# Patient Record
Sex: Female | Born: 1935 | Race: Black or African American | Hispanic: No | Smoking: Former smoker
Health system: Southern US, Community
[De-identification: ages and names within clinical notes are randomized; demographics above are authoritative.]

## PROBLEM LIST (undated history)

## (undated) DIAGNOSIS — M79609 Pain in unspecified limb: Secondary | ICD-10-CM

## (undated) DIAGNOSIS — E049 Nontoxic goiter, unspecified: Secondary | ICD-10-CM

## (undated) DIAGNOSIS — E785 Hyperlipidemia, unspecified: Secondary | ICD-10-CM

## (undated) DIAGNOSIS — Z87828 Personal history of other (healed) physical injury and trauma: Secondary | ICD-10-CM

## (undated) DIAGNOSIS — R05 Cough: Secondary | ICD-10-CM

## (undated) DIAGNOSIS — R221 Localized swelling, mass and lump, neck: Secondary | ICD-10-CM

## (undated) DIAGNOSIS — A419 Sepsis, unspecified organism: Secondary | ICD-10-CM

## (undated) DIAGNOSIS — R55 Syncope and collapse: Secondary | ICD-10-CM

## (undated) DIAGNOSIS — M1990 Unspecified osteoarthritis, unspecified site: Secondary | ICD-10-CM

## (undated) DIAGNOSIS — R809 Proteinuria, unspecified: Secondary | ICD-10-CM

## (undated) DIAGNOSIS — N289 Disorder of kidney and ureter, unspecified: Secondary | ICD-10-CM

## (undated) DIAGNOSIS — Z87440 Personal history of urinary (tract) infections: Secondary | ICD-10-CM

## (undated) DIAGNOSIS — I10 Essential (primary) hypertension: Secondary | ICD-10-CM

## (undated) DIAGNOSIS — I714 Abdominal aortic aneurysm, without rupture, unspecified: Secondary | ICD-10-CM

## (undated) DIAGNOSIS — J449 Chronic obstructive pulmonary disease, unspecified: Secondary | ICD-10-CM

## (undated) DIAGNOSIS — R001 Bradycardia, unspecified: Secondary | ICD-10-CM

## (undated) DIAGNOSIS — E669 Obesity, unspecified: Secondary | ICD-10-CM

## (undated) DIAGNOSIS — R053 Chronic cough: Secondary | ICD-10-CM

## (undated) DIAGNOSIS — N390 Urinary tract infection, site not specified: Secondary | ICD-10-CM

## (undated) DIAGNOSIS — D649 Anemia, unspecified: Secondary | ICD-10-CM

## (undated) DIAGNOSIS — S065X9A Traumatic subdural hemorrhage with loss of consciousness of unspecified duration, initial encounter: Secondary | ICD-10-CM

## (undated) DIAGNOSIS — I498 Other specified cardiac arrhythmias: Secondary | ICD-10-CM

## (undated) DIAGNOSIS — I82409 Acute embolism and thrombosis of unspecified deep veins of unspecified lower extremity: Secondary | ICD-10-CM

## (undated) HISTORY — DX: Personal history of urinary (tract) infections: Z87.440

## (undated) HISTORY — DX: Chronic cough: R05.3

## (undated) HISTORY — DX: Essential (primary) hypertension: I10

## (undated) HISTORY — DX: Disorder of kidney and ureter, unspecified: N28.9

## (undated) HISTORY — PX: CHOLECYSTECTOMY: SHX55

## (undated) HISTORY — DX: Localized swelling, mass and lump, neck: R22.1

## (undated) HISTORY — DX: Proteinuria, unspecified: R80.9

## (undated) HISTORY — DX: Obesity, unspecified: E66.9

## (undated) HISTORY — DX: Abdominal aortic aneurysm, without rupture: I71.4

## (undated) HISTORY — DX: Hyperlipidemia, unspecified: E78.5

## (undated) HISTORY — DX: Sepsis, unspecified organism: A41.9

## (undated) HISTORY — DX: Other specified cardiac arrhythmias: I49.8

## (undated) HISTORY — DX: Unspecified osteoarthritis, unspecified site: M19.90

## (undated) HISTORY — DX: Urinary tract infection, site not specified: N39.0

## (undated) HISTORY — DX: Cough: R05

## (undated) HISTORY — DX: Bradycardia, unspecified: R00.1

## (undated) HISTORY — PX: OTHER SURGICAL HISTORY: SHX169

## (undated) HISTORY — DX: Acute embolism and thrombosis of unspecified deep veins of unspecified lower extremity: I82.409

## (undated) HISTORY — DX: Personal history of other (healed) physical injury and trauma: Z87.828

## (undated) HISTORY — DX: Chronic obstructive pulmonary disease, unspecified: J44.9

## (undated) HISTORY — DX: Abdominal aortic aneurysm, without rupture, unspecified: I71.40

## (undated) HISTORY — DX: Syncope and collapse: R55

## (undated) HISTORY — DX: Traumatic subdural hemorrhage with loss of consciousness of unspecified duration, initial encounter: S06.5X9A

## (undated) HISTORY — PX: PACEMAKER PLACEMENT: SHX43

## (undated) HISTORY — DX: Nontoxic goiter, unspecified: E04.9

## (undated) HISTORY — DX: Anemia, unspecified: D64.9

## (undated) HISTORY — PX: DOPPLER ECHOCARDIOGRAPHY: SHX263

## (undated) HISTORY — DX: Pain in unspecified limb: M79.609

## (undated) HISTORY — PX: PACEMAKER INSERTION: SHX728

---

## 1997-07-30 ENCOUNTER — Emergency Department (HOSPITAL_COMMUNITY): Admission: EM | Admit: 1997-07-30 | Discharge: 1997-07-30 | Payer: Self-pay | Admitting: Emergency Medicine

## 1998-03-11 ENCOUNTER — Emergency Department (HOSPITAL_COMMUNITY): Admission: EM | Admit: 1998-03-11 | Discharge: 1998-03-11 | Payer: Self-pay | Admitting: Emergency Medicine

## 1998-03-11 ENCOUNTER — Encounter: Payer: Self-pay | Admitting: Emergency Medicine

## 1998-03-26 LAB — HM COLONOSCOPY

## 1998-05-25 ENCOUNTER — Other Ambulatory Visit: Admission: RE | Admit: 1998-05-25 | Discharge: 1998-05-25 | Payer: Self-pay | Admitting: Gastroenterology

## 1999-09-06 ENCOUNTER — Encounter: Admission: RE | Admit: 1999-09-06 | Discharge: 1999-12-05 | Payer: Self-pay | Admitting: Internal Medicine

## 2000-09-23 HISTORY — PX: OTHER SURGICAL HISTORY: SHX169

## 2000-11-28 ENCOUNTER — Ambulatory Visit (HOSPITAL_COMMUNITY): Admission: RE | Admit: 2000-11-28 | Discharge: 2000-11-28 | Payer: Self-pay | Admitting: Orthopedic Surgery

## 2000-11-28 ENCOUNTER — Encounter: Payer: Self-pay | Admitting: Orthopedic Surgery

## 2001-07-14 ENCOUNTER — Other Ambulatory Visit: Admission: RE | Admit: 2001-07-14 | Discharge: 2001-07-14 | Payer: Self-pay | Admitting: Internal Medicine

## 2002-12-28 ENCOUNTER — Encounter: Admission: RE | Admit: 2002-12-28 | Discharge: 2002-12-28 | Payer: Self-pay | Admitting: Internal Medicine

## 2002-12-28 ENCOUNTER — Encounter: Payer: Self-pay | Admitting: Internal Medicine

## 2003-01-21 ENCOUNTER — Other Ambulatory Visit: Admission: RE | Admit: 2003-01-21 | Discharge: 2003-01-21 | Payer: Self-pay | Admitting: Obstetrics and Gynecology

## 2003-02-11 ENCOUNTER — Inpatient Hospital Stay (HOSPITAL_COMMUNITY): Admission: EM | Admit: 2003-02-11 | Discharge: 2003-02-12 | Payer: Self-pay | Admitting: Emergency Medicine

## 2003-03-12 ENCOUNTER — Encounter (INDEPENDENT_AMBULATORY_CARE_PROVIDER_SITE_OTHER): Payer: Self-pay | Admitting: Specialist

## 2003-03-12 ENCOUNTER — Ambulatory Visit (HOSPITAL_BASED_OUTPATIENT_CLINIC_OR_DEPARTMENT_OTHER): Admission: RE | Admit: 2003-03-12 | Discharge: 2003-03-12 | Payer: Self-pay | Admitting: Obstetrics and Gynecology

## 2003-03-12 ENCOUNTER — Ambulatory Visit (HOSPITAL_COMMUNITY): Admission: RE | Admit: 2003-03-12 | Discharge: 2003-03-12 | Payer: Self-pay | Admitting: Obstetrics and Gynecology

## 2003-05-21 ENCOUNTER — Emergency Department (HOSPITAL_COMMUNITY): Admission: EM | Admit: 2003-05-21 | Discharge: 2003-05-21 | Payer: Self-pay | Admitting: Emergency Medicine

## 2003-05-25 HISTORY — PX: OTHER SURGICAL HISTORY: SHX169

## 2003-05-31 ENCOUNTER — Ambulatory Visit (HOSPITAL_COMMUNITY): Admission: RE | Admit: 2003-05-31 | Discharge: 2003-05-31 | Payer: Self-pay | Admitting: Vascular Surgery

## 2003-06-08 ENCOUNTER — Inpatient Hospital Stay (HOSPITAL_COMMUNITY): Admission: RE | Admit: 2003-06-08 | Discharge: 2003-06-09 | Payer: Self-pay | Admitting: Vascular Surgery

## 2003-06-25 HISTORY — PX: ABDOMINAL AORTIC ANEURYSM REPAIR: SUR1152

## 2003-07-05 ENCOUNTER — Inpatient Hospital Stay (HOSPITAL_COMMUNITY): Admission: RE | Admit: 2003-07-05 | Discharge: 2003-07-12 | Payer: Self-pay | Admitting: Vascular Surgery

## 2003-07-05 ENCOUNTER — Encounter (INDEPENDENT_AMBULATORY_CARE_PROVIDER_SITE_OTHER): Payer: Self-pay | Admitting: *Deleted

## 2004-02-04 ENCOUNTER — Ambulatory Visit: Payer: Self-pay | Admitting: Internal Medicine

## 2004-04-11 ENCOUNTER — Other Ambulatory Visit: Admission: RE | Admit: 2004-04-11 | Discharge: 2004-04-11 | Payer: Self-pay | Admitting: Obstetrics and Gynecology

## 2004-06-13 ENCOUNTER — Ambulatory Visit: Payer: Self-pay | Admitting: Internal Medicine

## 2004-06-16 ENCOUNTER — Emergency Department (HOSPITAL_COMMUNITY): Admission: EM | Admit: 2004-06-16 | Discharge: 2004-06-16 | Payer: Self-pay | Admitting: Emergency Medicine

## 2004-06-17 ENCOUNTER — Inpatient Hospital Stay (HOSPITAL_COMMUNITY): Admission: AD | Admit: 2004-06-17 | Discharge: 2004-06-27 | Payer: Self-pay | Admitting: Emergency Medicine

## 2004-06-19 ENCOUNTER — Ambulatory Visit: Payer: Self-pay | Admitting: Pulmonary Disease

## 2004-06-23 ENCOUNTER — Encounter: Payer: Self-pay | Admitting: Pulmonary Disease

## 2004-06-28 ENCOUNTER — Ambulatory Visit: Payer: Self-pay | Admitting: Internal Medicine

## 2004-07-28 ENCOUNTER — Ambulatory Visit: Payer: Self-pay | Admitting: Internal Medicine

## 2004-09-25 ENCOUNTER — Ambulatory Visit: Payer: Self-pay | Admitting: Internal Medicine

## 2004-12-07 ENCOUNTER — Ambulatory Visit (HOSPITAL_COMMUNITY): Admission: RE | Admit: 2004-12-07 | Discharge: 2004-12-07 | Payer: Self-pay | Admitting: Neurology

## 2004-12-19 ENCOUNTER — Emergency Department (HOSPITAL_COMMUNITY): Admission: EM | Admit: 2004-12-19 | Discharge: 2004-12-19 | Payer: Self-pay | Admitting: Emergency Medicine

## 2004-12-24 DIAGNOSIS — A419 Sepsis, unspecified organism: Secondary | ICD-10-CM

## 2004-12-24 HISTORY — DX: Sepsis, unspecified organism: A41.9

## 2005-01-02 ENCOUNTER — Ambulatory Visit: Payer: Self-pay | Admitting: Internal Medicine

## 2005-01-02 LAB — CONVERTED CEMR LAB
ALT: 11 units/L (ref 0–40)
AST: 17 units/L (ref 0–37)
Albumin: 3.8 g/dL (ref 3.5–5.2)
BUN: 48 mg/dL — ABNORMAL HIGH (ref 6–23)
CO2: 29 meq/L (ref 19–32)
Chloride: 104 meq/L (ref 96–112)
Cholesterol: 187 mg/dL (ref 0–200)
Eosinophils Absolute: 0.1 10*3/uL (ref 0.0–0.6)
HCT: 34.9 % — ABNORMAL LOW (ref 36.0–46.0)
Hemoglobin: 11.8 g/dL — ABNORMAL LOW (ref 12.0–15.0)
LDL Cholesterol: 125 mg/dL — ABNORMAL HIGH (ref 0–99)
Lymphocytes Relative: 21.7 % (ref 12.0–46.0)
MCHC: 33.7 g/dL (ref 30.0–36.0)
MCV: 85.1 fL (ref 78.0–100.0)
Monocytes Absolute: 0.3 10*3/uL (ref 0.2–0.7)
VLDL: 21 mg/dL (ref 0–40)

## 2005-01-04 ENCOUNTER — Inpatient Hospital Stay (HOSPITAL_COMMUNITY): Admission: EM | Admit: 2005-01-04 | Discharge: 2005-01-09 | Payer: Self-pay | Admitting: Emergency Medicine

## 2005-04-17 ENCOUNTER — Ambulatory Visit: Payer: Self-pay | Admitting: Internal Medicine

## 2005-05-10 ENCOUNTER — Ambulatory Visit (HOSPITAL_COMMUNITY): Admission: RE | Admit: 2005-05-10 | Discharge: 2005-05-10 | Payer: Self-pay | Admitting: Urology

## 2005-06-15 ENCOUNTER — Ambulatory Visit: Payer: Self-pay | Admitting: Internal Medicine

## 2005-06-26 ENCOUNTER — Ambulatory Visit (HOSPITAL_COMMUNITY): Admission: RE | Admit: 2005-06-26 | Discharge: 2005-06-26 | Payer: Self-pay | Admitting: Internal Medicine

## 2005-08-08 ENCOUNTER — Emergency Department (HOSPITAL_COMMUNITY): Admission: EM | Admit: 2005-08-08 | Discharge: 2005-08-08 | Payer: Self-pay | Admitting: Emergency Medicine

## 2005-09-23 ENCOUNTER — Emergency Department (HOSPITAL_COMMUNITY): Admission: EM | Admit: 2005-09-23 | Discharge: 2005-09-23 | Payer: Self-pay | Admitting: Emergency Medicine

## 2005-10-15 ENCOUNTER — Ambulatory Visit: Payer: Self-pay | Admitting: Internal Medicine

## 2005-11-26 ENCOUNTER — Emergency Department (HOSPITAL_COMMUNITY): Admission: EM | Admit: 2005-11-26 | Discharge: 2005-11-26 | Payer: Self-pay | Admitting: Emergency Medicine

## 2005-12-27 ENCOUNTER — Emergency Department (HOSPITAL_COMMUNITY): Admission: EM | Admit: 2005-12-27 | Discharge: 2005-12-27 | Payer: Self-pay | Admitting: Emergency Medicine

## 2006-01-19 ENCOUNTER — Emergency Department (HOSPITAL_COMMUNITY): Admission: EM | Admit: 2006-01-19 | Discharge: 2006-01-19 | Payer: Self-pay | Admitting: Emergency Medicine

## 2006-02-18 ENCOUNTER — Ambulatory Visit: Payer: Self-pay | Admitting: Internal Medicine

## 2006-04-10 ENCOUNTER — Emergency Department (HOSPITAL_COMMUNITY): Admission: EM | Admit: 2006-04-10 | Discharge: 2006-04-10 | Payer: Self-pay | Admitting: Emergency Medicine

## 2006-05-06 ENCOUNTER — Emergency Department (HOSPITAL_COMMUNITY): Admission: EM | Admit: 2006-05-06 | Discharge: 2006-05-06 | Payer: Self-pay | Admitting: Emergency Medicine

## 2006-06-06 ENCOUNTER — Ambulatory Visit: Payer: Self-pay | Admitting: Internal Medicine

## 2006-06-06 LAB — CONVERTED CEMR LAB
CO2: 30 meq/L (ref 19–32)
HCT: 41.1 % (ref 36.0–46.0)
Hemoglobin: 13.5 g/dL (ref 12.0–15.0)
Hgb A1c MFr Bld: 6.2 % — ABNORMAL HIGH (ref 4.6–6.0)
Potassium: 4.8 meq/L (ref 3.5–5.1)
Sodium: 142 meq/L (ref 135–145)
TSH: 2.41 microintl units/mL (ref 0.35–5.50)

## 2006-07-12 ENCOUNTER — Encounter: Payer: Self-pay | Admitting: Internal Medicine

## 2006-07-31 ENCOUNTER — Ambulatory Visit (HOSPITAL_COMMUNITY): Admission: RE | Admit: 2006-07-31 | Discharge: 2006-07-31 | Payer: Self-pay | Admitting: Urology

## 2006-10-15 ENCOUNTER — Encounter: Payer: Self-pay | Admitting: Internal Medicine

## 2006-10-25 ENCOUNTER — Encounter: Payer: Self-pay | Admitting: Internal Medicine

## 2006-11-04 ENCOUNTER — Encounter: Payer: Self-pay | Admitting: Internal Medicine

## 2006-11-19 ENCOUNTER — Telehealth: Payer: Self-pay | Admitting: Internal Medicine

## 2006-11-26 DIAGNOSIS — I10 Essential (primary) hypertension: Secondary | ICD-10-CM

## 2006-11-26 DIAGNOSIS — M1990 Unspecified osteoarthritis, unspecified site: Secondary | ICD-10-CM | POA: Insufficient documentation

## 2006-11-26 DIAGNOSIS — E669 Obesity, unspecified: Secondary | ICD-10-CM | POA: Insufficient documentation

## 2006-11-26 DIAGNOSIS — N390 Urinary tract infection, site not specified: Secondary | ICD-10-CM | POA: Insufficient documentation

## 2006-11-26 DIAGNOSIS — E119 Type 2 diabetes mellitus without complications: Secondary | ICD-10-CM

## 2006-11-26 DIAGNOSIS — I714 Abdominal aortic aneurysm, without rupture, unspecified: Secondary | ICD-10-CM | POA: Insufficient documentation

## 2006-11-26 DIAGNOSIS — E785 Hyperlipidemia, unspecified: Secondary | ICD-10-CM

## 2006-11-26 DIAGNOSIS — R809 Proteinuria, unspecified: Secondary | ICD-10-CM | POA: Insufficient documentation

## 2006-11-26 DIAGNOSIS — R05 Cough: Secondary | ICD-10-CM | POA: Insufficient documentation

## 2006-11-26 DIAGNOSIS — N259 Disorder resulting from impaired renal tubular function, unspecified: Secondary | ICD-10-CM | POA: Insufficient documentation

## 2006-12-17 ENCOUNTER — Encounter: Payer: Self-pay | Admitting: Internal Medicine

## 2007-01-07 ENCOUNTER — Ambulatory Visit: Payer: Self-pay | Admitting: Internal Medicine

## 2007-01-07 DIAGNOSIS — J309 Allergic rhinitis, unspecified: Secondary | ICD-10-CM | POA: Insufficient documentation

## 2007-01-07 LAB — CONVERTED CEMR LAB
Glucose, Urine, Semiquant: NEGATIVE
Ketones, urine, test strip: NEGATIVE
Urobilinogen, UA: 0.2

## 2007-01-08 ENCOUNTER — Encounter: Payer: Self-pay | Admitting: Internal Medicine

## 2007-01-09 LAB — CONVERTED CEMR LAB
ALT: 22 units/L (ref 0–35)
AST: 36 units/L (ref 0–37)
Albumin: 4.4 g/dL (ref 3.5–5.2)
Alkaline Phosphatase: 78 units/L (ref 39–117)
Basophils Absolute: 0 10*3/uL (ref 0.0–0.1)
Calcium: 10.8 mg/dL — ABNORMAL HIGH (ref 8.4–10.5)
Chloride: 107 meq/L (ref 96–112)
Cholesterol: 145 mg/dL (ref 0–200)
Creatinine, Ser: 2.5 mg/dL — ABNORMAL HIGH (ref 0.4–1.2)
GFR calc Af Amer: 24 mL/min
GFR calc non Af Amer: 20 mL/min
HCT: 39.6 % (ref 36.0–46.0)
Hgb A1c MFr Bld: 6 % (ref 4.6–6.0)
LDL Cholesterol: 83 mg/dL (ref 0–99)
MCV: 87.6 fL (ref 78.0–100.0)
Potassium: 5.9 meq/L — ABNORMAL HIGH (ref 3.5–5.1)
Sodium: 142 meq/L (ref 135–145)
Total Bilirubin: 0.6 mg/dL (ref 0.3–1.2)
Total CHOL/HDL Ratio: 3.3
Triglycerides: 88 mg/dL (ref 0–149)
VLDL: 18 mg/dL (ref 0–40)

## 2007-01-13 ENCOUNTER — Ambulatory Visit: Payer: Self-pay | Admitting: Internal Medicine

## 2007-03-07 ENCOUNTER — Encounter: Payer: Self-pay | Admitting: Internal Medicine

## 2007-03-17 ENCOUNTER — Ambulatory Visit: Payer: Self-pay | Admitting: Internal Medicine

## 2007-04-02 ENCOUNTER — Encounter: Payer: Self-pay | Admitting: Internal Medicine

## 2007-04-06 ENCOUNTER — Encounter: Payer: Self-pay | Admitting: Internal Medicine

## 2007-04-06 ENCOUNTER — Emergency Department (HOSPITAL_COMMUNITY): Admission: EM | Admit: 2007-04-06 | Discharge: 2007-04-06 | Payer: Self-pay | Admitting: Emergency Medicine

## 2007-04-15 ENCOUNTER — Encounter: Payer: Self-pay | Admitting: Internal Medicine

## 2007-04-28 ENCOUNTER — Ambulatory Visit: Payer: Self-pay | Admitting: Internal Medicine

## 2007-04-28 DIAGNOSIS — K219 Gastro-esophageal reflux disease without esophagitis: Secondary | ICD-10-CM

## 2007-04-28 LAB — CONVERTED CEMR LAB
Bilirubin Urine: NEGATIVE
Blood in Urine, dipstick: NEGATIVE
Glucose, Urine, Semiquant: NEGATIVE
WBC Urine, dipstick: NEGATIVE
pH: 5.5

## 2007-05-13 ENCOUNTER — Telehealth: Payer: Self-pay | Admitting: *Deleted

## 2007-05-20 ENCOUNTER — Telehealth: Payer: Self-pay | Admitting: Internal Medicine

## 2007-05-24 ENCOUNTER — Emergency Department (HOSPITAL_COMMUNITY): Admission: EM | Admit: 2007-05-24 | Discharge: 2007-05-24 | Payer: Self-pay | Admitting: Emergency Medicine

## 2007-07-09 ENCOUNTER — Telehealth: Payer: Self-pay | Admitting: *Deleted

## 2007-07-09 ENCOUNTER — Encounter: Payer: Self-pay | Admitting: Internal Medicine

## 2007-07-18 ENCOUNTER — Encounter: Payer: Self-pay | Admitting: Internal Medicine

## 2007-07-23 ENCOUNTER — Telehealth: Payer: Self-pay | Admitting: Internal Medicine

## 2007-09-10 ENCOUNTER — Telehealth: Payer: Self-pay | Admitting: *Deleted

## 2007-09-30 ENCOUNTER — Encounter: Payer: Self-pay | Admitting: Internal Medicine

## 2007-10-11 ENCOUNTER — Emergency Department (HOSPITAL_COMMUNITY): Admission: EM | Admit: 2007-10-11 | Discharge: 2007-10-11 | Payer: Self-pay | Admitting: Emergency Medicine

## 2007-10-15 ENCOUNTER — Telehealth: Payer: Self-pay | Admitting: *Deleted

## 2007-11-07 ENCOUNTER — Emergency Department (HOSPITAL_COMMUNITY): Admission: EM | Admit: 2007-11-07 | Discharge: 2007-11-07 | Payer: Self-pay | Admitting: Emergency Medicine

## 2007-11-26 ENCOUNTER — Encounter: Payer: Self-pay | Admitting: Internal Medicine

## 2007-12-21 ENCOUNTER — Emergency Department (HOSPITAL_COMMUNITY): Admission: EM | Admit: 2007-12-21 | Discharge: 2007-12-21 | Payer: Self-pay | Admitting: Orthopedic Surgery

## 2008-01-02 ENCOUNTER — Ambulatory Visit: Payer: Self-pay | Admitting: Internal Medicine

## 2008-01-02 LAB — CONVERTED CEMR LAB: Blood Glucose, Fingerstick: 117

## 2008-01-08 ENCOUNTER — Ambulatory Visit (HOSPITAL_COMMUNITY): Admission: RE | Admit: 2008-01-08 | Discharge: 2008-01-08 | Payer: Self-pay | Admitting: Pulmonary Disease

## 2008-01-14 ENCOUNTER — Encounter: Payer: Self-pay | Admitting: Internal Medicine

## 2008-01-29 ENCOUNTER — Encounter: Payer: Self-pay | Admitting: Internal Medicine

## 2008-02-11 ENCOUNTER — Telehealth: Payer: Self-pay | Admitting: *Deleted

## 2008-02-12 ENCOUNTER — Emergency Department (HOSPITAL_COMMUNITY): Admission: EM | Admit: 2008-02-12 | Discharge: 2008-02-12 | Payer: Self-pay | Admitting: Emergency Medicine

## 2008-02-13 ENCOUNTER — Encounter: Payer: Self-pay | Admitting: Internal Medicine

## 2008-04-06 ENCOUNTER — Encounter: Payer: Self-pay | Admitting: Internal Medicine

## 2008-04-06 LAB — CONVERTED CEMR LAB: Glucose, Bld: 37 mg/dL

## 2008-05-24 ENCOUNTER — Encounter: Payer: Self-pay | Admitting: Internal Medicine

## 2008-06-02 ENCOUNTER — Emergency Department (HOSPITAL_COMMUNITY): Admission: EM | Admit: 2008-06-02 | Discharge: 2008-06-02 | Payer: Self-pay | Admitting: Emergency Medicine

## 2008-07-02 ENCOUNTER — Ambulatory Visit: Payer: Self-pay | Admitting: Internal Medicine

## 2008-07-02 LAB — CONVERTED CEMR LAB: Blood Glucose, Fingerstick: 80

## 2008-07-05 ENCOUNTER — Telehealth: Payer: Self-pay | Admitting: Internal Medicine

## 2008-07-09 ENCOUNTER — Encounter: Payer: Self-pay | Admitting: Internal Medicine

## 2008-07-13 ENCOUNTER — Telehealth: Payer: Self-pay | Admitting: Speech Pathology

## 2008-07-19 ENCOUNTER — Ambulatory Visit: Payer: Self-pay | Admitting: Internal Medicine

## 2008-07-19 ENCOUNTER — Encounter: Payer: Self-pay | Admitting: Internal Medicine

## 2008-08-25 ENCOUNTER — Encounter: Payer: Self-pay | Admitting: Internal Medicine

## 2008-08-25 ENCOUNTER — Telehealth (INDEPENDENT_AMBULATORY_CARE_PROVIDER_SITE_OTHER): Payer: Self-pay | Admitting: *Deleted

## 2008-09-28 ENCOUNTER — Encounter: Payer: Self-pay | Admitting: Internal Medicine

## 2008-09-29 ENCOUNTER — Encounter: Payer: Self-pay | Admitting: Internal Medicine

## 2008-11-12 ENCOUNTER — Emergency Department (HOSPITAL_COMMUNITY): Admission: EM | Admit: 2008-11-12 | Discharge: 2008-11-12 | Payer: Self-pay | Admitting: Emergency Medicine

## 2008-11-23 ENCOUNTER — Encounter: Payer: Self-pay | Admitting: *Deleted

## 2008-11-23 ENCOUNTER — Ambulatory Visit: Payer: Self-pay | Admitting: Internal Medicine

## 2008-11-23 DIAGNOSIS — E041 Nontoxic single thyroid nodule: Secondary | ICD-10-CM

## 2008-11-23 DIAGNOSIS — R609 Edema, unspecified: Secondary | ICD-10-CM

## 2008-11-23 LAB — CONVERTED CEMR LAB
Creatinine, Ser: 2.45 mg/dL
Hemoglobin: 12.7 g/dL
TSH: 3.24 microintl units/mL

## 2008-12-03 ENCOUNTER — Encounter: Payer: Self-pay | Admitting: Internal Medicine

## 2008-12-03 ENCOUNTER — Encounter: Admission: RE | Admit: 2008-12-03 | Discharge: 2008-12-03 | Payer: Self-pay | Admitting: Internal Medicine

## 2008-12-07 ENCOUNTER — Ambulatory Visit: Payer: Self-pay | Admitting: Internal Medicine

## 2008-12-15 ENCOUNTER — Telehealth: Payer: Self-pay | Admitting: *Deleted

## 2008-12-22 ENCOUNTER — Encounter: Admission: RE | Admit: 2008-12-22 | Discharge: 2008-12-22 | Payer: Self-pay | Admitting: Internal Medicine

## 2008-12-22 ENCOUNTER — Other Ambulatory Visit: Admission: RE | Admit: 2008-12-22 | Discharge: 2008-12-22 | Payer: Self-pay | Admitting: Interventional Radiology

## 2008-12-23 ENCOUNTER — Encounter: Payer: Self-pay | Admitting: Internal Medicine

## 2009-01-27 ENCOUNTER — Encounter: Payer: Self-pay | Admitting: *Deleted

## 2009-03-09 ENCOUNTER — Encounter: Payer: Self-pay | Admitting: *Deleted

## 2009-03-09 ENCOUNTER — Encounter: Payer: Self-pay | Admitting: Internal Medicine

## 2009-03-09 LAB — CONVERTED CEMR LAB
Glucose, Bld: 68 mg/dL
Hemoglobin: 13.5 g/dL
Potassium: 4.3 meq/L

## 2009-03-26 HISTORY — PX: CATARACT EXTRACTION: SUR2

## 2009-04-27 ENCOUNTER — Encounter: Payer: Self-pay | Admitting: *Deleted

## 2009-05-11 ENCOUNTER — Ambulatory Visit: Payer: Self-pay | Admitting: Internal Medicine

## 2009-05-25 LAB — CONVERTED CEMR LAB
Alkaline Phosphatase: 75 units/L (ref 39–117)
BUN: 39 mg/dL — ABNORMAL HIGH (ref 6–23)
Bilirubin, Direct: 0.2 mg/dL (ref 0.0–0.3)
Chloride: 107 meq/L (ref 96–112)
GFR calc non Af Amer: 23.83 mL/min (ref >60)
Glucose, Bld: 89 mg/dL (ref 70–99)
Hgb A1c MFr Bld: 6 % (ref 4.6–6.5)
LDL Cholesterol: 58 mg/dL (ref 0–99)
Potassium: 4.8 meq/L (ref 3.5–5.1)
Sodium: 143 meq/L (ref 135–145)
Total Bilirubin: 0.5 mg/dL (ref 0.3–1.2)
Total CHOL/HDL Ratio: 2

## 2009-05-29 ENCOUNTER — Emergency Department (HOSPITAL_COMMUNITY): Admission: EM | Admit: 2009-05-29 | Discharge: 2009-05-29 | Payer: Self-pay | Admitting: Emergency Medicine

## 2009-05-31 ENCOUNTER — Ambulatory Visit: Payer: Self-pay | Admitting: Internal Medicine

## 2009-05-31 LAB — CONVERTED CEMR LAB
Blood in Urine, dipstick: NEGATIVE
Ketones, urine, test strip: NEGATIVE
Nitrite: NEGATIVE
Protein, U semiquant: 2
Specific Gravity, Urine: 1.015
pH: 5.5

## 2009-06-03 ENCOUNTER — Ambulatory Visit (HOSPITAL_COMMUNITY): Admission: RE | Admit: 2009-06-03 | Discharge: 2009-06-03 | Payer: Self-pay | Admitting: Internal Medicine

## 2009-06-03 ENCOUNTER — Telehealth: Payer: Self-pay | Admitting: *Deleted

## 2009-06-24 ENCOUNTER — Encounter: Payer: Self-pay | Admitting: Internal Medicine

## 2009-07-07 ENCOUNTER — Encounter: Payer: Self-pay | Admitting: Internal Medicine

## 2009-07-22 ENCOUNTER — Telehealth: Payer: Self-pay | Admitting: Internal Medicine

## 2009-09-05 ENCOUNTER — Emergency Department (HOSPITAL_COMMUNITY): Admission: EM | Admit: 2009-09-05 | Discharge: 2009-09-05 | Payer: Self-pay | Admitting: Emergency Medicine

## 2009-09-05 ENCOUNTER — Telehealth: Payer: Self-pay | Admitting: Family Medicine

## 2009-09-06 ENCOUNTER — Ambulatory Visit: Payer: Self-pay | Admitting: Family Medicine

## 2009-09-06 ENCOUNTER — Encounter: Payer: Self-pay | Admitting: Internal Medicine

## 2009-09-06 DIAGNOSIS — M79609 Pain in unspecified limb: Secondary | ICD-10-CM

## 2009-09-06 HISTORY — DX: Pain in unspecified limb: M79.609

## 2009-09-07 ENCOUNTER — Ambulatory Visit: Payer: Self-pay | Admitting: Family Medicine

## 2009-09-07 DIAGNOSIS — I82409 Acute embolism and thrombosis of unspecified deep veins of unspecified lower extremity: Secondary | ICD-10-CM | POA: Insufficient documentation

## 2009-09-07 HISTORY — DX: Acute embolism and thrombosis of unspecified deep veins of unspecified lower extremity: I82.409

## 2009-09-08 ENCOUNTER — Encounter: Payer: Self-pay | Admitting: Internal Medicine

## 2009-09-09 ENCOUNTER — Ambulatory Visit: Payer: Self-pay | Admitting: Family Medicine

## 2009-09-12 ENCOUNTER — Ambulatory Visit: Payer: Self-pay | Admitting: Internal Medicine

## 2009-09-14 ENCOUNTER — Ambulatory Visit: Payer: Self-pay | Admitting: Internal Medicine

## 2009-09-14 LAB — CONVERTED CEMR LAB: INR: 2.8

## 2009-09-19 ENCOUNTER — Ambulatory Visit: Payer: Self-pay | Admitting: Internal Medicine

## 2009-09-19 DIAGNOSIS — T50995A Adverse effect of other drugs, medicaments and biological substances, initial encounter: Secondary | ICD-10-CM | POA: Insufficient documentation

## 2009-09-19 LAB — CONVERTED CEMR LAB: INR: 2.7

## 2009-09-27 ENCOUNTER — Ambulatory Visit: Payer: Self-pay | Admitting: Internal Medicine

## 2009-09-27 LAB — CONVERTED CEMR LAB: INR: 3.9

## 2009-10-03 ENCOUNTER — Emergency Department (HOSPITAL_COMMUNITY): Admission: EM | Admit: 2009-10-03 | Discharge: 2009-10-03 | Payer: Self-pay | Admitting: Emergency Medicine

## 2009-10-07 ENCOUNTER — Ambulatory Visit: Payer: Self-pay | Admitting: Internal Medicine

## 2009-10-07 LAB — CONVERTED CEMR LAB: INR: 3.6

## 2009-10-17 ENCOUNTER — Encounter: Payer: Self-pay | Admitting: Internal Medicine

## 2009-10-19 ENCOUNTER — Encounter: Payer: Self-pay | Admitting: Internal Medicine

## 2009-10-19 LAB — HM DIABETES EYE EXAM: HM Diabetic Eye Exam: NORMAL

## 2009-10-20 ENCOUNTER — Ambulatory Visit: Payer: Self-pay | Admitting: Internal Medicine

## 2009-10-20 LAB — CONVERTED CEMR LAB: INR: 3

## 2009-10-21 ENCOUNTER — Telehealth: Payer: Self-pay | Admitting: *Deleted

## 2009-11-03 ENCOUNTER — Encounter: Payer: Self-pay | Admitting: Internal Medicine

## 2009-11-03 ENCOUNTER — Ambulatory Visit: Payer: Self-pay

## 2009-11-07 ENCOUNTER — Ambulatory Visit: Payer: Self-pay | Admitting: Internal Medicine

## 2009-11-07 ENCOUNTER — Encounter: Payer: Self-pay | Admitting: *Deleted

## 2009-11-15 ENCOUNTER — Emergency Department (HOSPITAL_COMMUNITY): Admission: EM | Admit: 2009-11-15 | Discharge: 2009-11-15 | Payer: Self-pay | Admitting: Emergency Medicine

## 2009-11-16 ENCOUNTER — Encounter: Payer: Self-pay | Admitting: Internal Medicine

## 2009-11-30 ENCOUNTER — Telehealth: Payer: Self-pay | Admitting: Internal Medicine

## 2009-12-15 ENCOUNTER — Encounter: Payer: Self-pay | Admitting: *Deleted

## 2009-12-16 ENCOUNTER — Encounter: Payer: Self-pay | Admitting: Internal Medicine

## 2009-12-22 ENCOUNTER — Ambulatory Visit: Payer: Self-pay | Admitting: Internal Medicine

## 2009-12-22 DIAGNOSIS — Z950 Presence of cardiac pacemaker: Secondary | ICD-10-CM | POA: Insufficient documentation

## 2009-12-22 LAB — CONVERTED CEMR LAB
Blood in Urine, dipstick: NEGATIVE
Ketones, urine, test strip: NEGATIVE
Nitrite: NEGATIVE
Protein, U semiquant: 100
Specific Gravity, Urine: 1.01
Urobilinogen, UA: NEGATIVE

## 2009-12-22 LAB — HM DIABETES FOOT EXAM

## 2009-12-23 ENCOUNTER — Encounter: Payer: Self-pay | Admitting: Internal Medicine

## 2010-01-02 ENCOUNTER — Encounter: Payer: Self-pay | Admitting: Internal Medicine

## 2010-01-07 ENCOUNTER — Encounter: Payer: Self-pay | Admitting: Internal Medicine

## 2010-01-13 ENCOUNTER — Ambulatory Visit: Payer: Self-pay | Admitting: Internal Medicine

## 2010-01-19 ENCOUNTER — Ambulatory Visit: Payer: Self-pay | Admitting: Internal Medicine

## 2010-01-20 ENCOUNTER — Encounter: Payer: Self-pay | Admitting: Internal Medicine

## 2010-01-20 LAB — CONVERTED CEMR LAB: Hgb A1c MFr Bld: 5.8 % (ref 4.6–6.5)

## 2010-01-25 ENCOUNTER — Telehealth: Payer: Self-pay | Admitting: *Deleted

## 2010-01-27 ENCOUNTER — Encounter: Payer: Self-pay | Admitting: Internal Medicine

## 2010-02-03 ENCOUNTER — Ambulatory Visit: Payer: Self-pay | Admitting: Internal Medicine

## 2010-02-03 LAB — CONVERTED CEMR LAB: INR: 1.3

## 2010-02-21 ENCOUNTER — Ambulatory Visit: Payer: Self-pay | Admitting: Internal Medicine

## 2010-02-21 DIAGNOSIS — R209 Unspecified disturbances of skin sensation: Secondary | ICD-10-CM

## 2010-02-21 LAB — CONVERTED CEMR LAB
Bilirubin Urine: NEGATIVE
Glucose, Urine, Semiquant: NEGATIVE
Ketones, urine, test strip: NEGATIVE
Nitrite: NEGATIVE
Specific Gravity, Urine: 1.01
pH: 5.5

## 2010-02-22 ENCOUNTER — Encounter: Payer: Self-pay | Admitting: Internal Medicine

## 2010-03-01 ENCOUNTER — Encounter: Payer: Self-pay | Admitting: Internal Medicine

## 2010-03-14 ENCOUNTER — Ambulatory Visit: Payer: Self-pay | Admitting: Cardiovascular Disease

## 2010-03-14 ENCOUNTER — Encounter: Payer: Self-pay | Admitting: Internal Medicine

## 2010-03-21 ENCOUNTER — Ambulatory Visit: Payer: Self-pay | Admitting: Internal Medicine

## 2010-03-21 ENCOUNTER — Telehealth: Payer: Self-pay | Admitting: Internal Medicine

## 2010-04-16 ENCOUNTER — Encounter: Payer: Self-pay | Admitting: Internal Medicine

## 2010-04-16 ENCOUNTER — Encounter: Payer: Self-pay | Admitting: Obstetrics and Gynecology

## 2010-04-23 LAB — CONVERTED CEMR LAB: INR: 2

## 2010-04-24 ENCOUNTER — Other Ambulatory Visit: Payer: Self-pay | Admitting: Obstetrics & Gynecology

## 2010-04-24 ENCOUNTER — Other Ambulatory Visit (HOSPITAL_COMMUNITY)
Admission: RE | Admit: 2010-04-24 | Discharge: 2010-04-24 | Disposition: A | Discharge status: Home / Self Care | Payer: Managed Care, Other (non HMO) | Source: Ambulatory Visit | Attending: Obstetrics & Gynecology | Admitting: Obstetrics & Gynecology

## 2010-04-24 DIAGNOSIS — Z124 Encounter for screening for malignant neoplasm of cervix: Secondary | ICD-10-CM | POA: Insufficient documentation

## 2010-04-25 NOTE — Progress Notes (Signed)
Summary: Samples of coumadin  Phone Note Call from Patient Call back at [facility] Phone [phone]   Caller: Patient Summary of Call: Pt needs some samples of coumadin if we have any. She can't afford the rx. Can we give her some samples? Initial call taken by: Romualdo Bolk, CMA Duncan Dull),  January 25, 2010 9:37 AM  Follow-up for Phone Call        ok if we have them  Follow-up by: Madelin Headings MD,  January 25, 2010 5:02 PM  Additional Follow-up for Phone Call Additional follow up Details #1::        Samples given to pt. Pt aware. Additional Follow-up by: Romualdo Bolk, CMA (AAMA),  January 26, 2010 8:46 AM

## 2010-04-25 NOTE — Assessment & Plan Note (Signed)
Summary: PT/NJR  Nurse Visit   Allergies: No Known Drug Allergies Laboratory Results   Blood Tests   Date/Time Received: February 03, 2010 10:23 AM  Date/Time Reported: February 03, 2010 10:23 AM    INR: 1.3   (Normal Range: 0.88-1.12   Therap INR: 2.0-3.5) Comments: Wynona Canes, CMA  February 03, 2010 10:23 AM     Orders Added: 1)  Est. Patient Level I [99211] 2)  Protime [16109UE]  Laboratory Results   Blood Tests      INR: 1.3   (Normal Range: 0.88-1.12   Therap INR: 2.0-3.5) Comments: Wynona Canes, CMA  February 03, 2010 10:23 AM       ANTICOAGULATION RECORD PREVIOUS REGIMEN & LAB RESULTS Anticoagulation Diagnosis:  V58.83,V58.61,453.41 on  09/12/2009 Previous INR Goal Range:  2.0-3.0 on  09/12/2009 Previous INR:  1.6 on  01/13/2010 Previous Coumadin Dose(mg):  5mg  on m,w,f 2.5mg  on other days on  12/22/2009 Previous Regimen:  5mg  on m,w,f & sat. 2.5mg  on other days on  01/13/2010  NEW REGIMEN & LAB RESULTS Current INR: 1.3 Current Coumadin Dose(mg): 5 mg on m,w,f,sat 2.5 other days Regimen: 2.5 only on  thurs 5 mg qd        Repeat testing in: return in 2 weeks MEDICATIONS MUCINEX 600 MG  TB12 (GUAIFENESIN) once daily as needed SIMVASTATIN 20 MG TABS (SIMVASTATIN) 1 by mouth once daily MULTIVITAMINS   CAPS (MULTIPLE VITAMIN)  GLUCOTROL XL 2.5 MG  TB24 (GLIPIZIDE) 1 by mouth once daily. PROVENTIL HFA 108 (90 BASE) MCG/ACT AERS (ALBUTEROL SULFATE) 1-2 puffs   eevery 6 hours as needed for wheezing AMLODIPINE BESYLATE 10 MG TABS (AMLODIPINE BESYLATE) 1 by mouth once daily NITROFURANTOIN MACROCRYSTAL 50 MG CAPS (NITROFURANTOIN MACROCRYSTAL) 1 by mouth once daily FUROSEMIDE 20 MG TABS (FUROSEMIDE) 1 by mouth once daily CALCIUM CARBONATE-VITAMIN D 600-400 MG-UNIT  TABS (CALCIUM CARBONATE-VITAMIN D)  HYDRALAZINE HCL 25 MG TABS (HYDRALAZINE HCL) 1 by mouth two times a day WARFARIN SODIUM 5 MG TABS (WARFARIN SODIUM) one by mouth once  daily   Anticoagulation Visit Questionnaire      Coumadin dose missed/changed:  No      Abnormal Bleeding Symptoms:  No   Any diet changes including alcohol intake, vegetables or greens since the last visit:  No Any illnesses or hospitalizations since the last visit:  No Any signs of clotting since the last visit (including chest discomfort, dizziness, shortness of breath, arm tingling, slurred speech, swelling or redness in leg):  No

## 2010-04-25 NOTE — Miscellaneous (Signed)
Summary: labs done by [facility]  Clinical Lists Changes  Observations: Added new observation of CREATININE: 2.04 mg/dL (64/40/3474 2:59) Added new observation of BG RANDOM: 68 mg/dL (56/38/7564 3:32) Added new observation of K SERUM: 4.3 meq/L (03/09/2009 9:20) Added new observation of HGB: 13.5 g/dL (95/18/8416 6:06)

## 2010-04-25 NOTE — Progress Notes (Signed)
Summary: Pt wanting to know if she can get PT done during OV on 12/22/09  Phone Note Call from Patient Call back at [facility] Phone [phone]   Caller: Patient Summary of Call: Pt called and had missed her pt appt for 11/29/09. Pt is sch to see Dr. [name] on 12/22/09 for a follow up and is wondering if she can just come in for pt the same day as her ov with Dr. [name], since she lives in [place] and it is difficult to get transportation. Pls advise.  Initial call taken by: Lucy Antigua,  November 30, 2009 9:33 AM  Follow-up for Phone Call        ok to do this at OV> Follow-up by: Madelin Headings MD,  December 01, 2009 8:21 AM  Additional Follow-up for Phone Call Additional follow up Details #1::        I called and notified pt that it is ok for her to have her pt appt during her ov on 12/22/09 as noted above. Additional Follow-up by: Lucy Antigua,  December 01, 2009 9:50 AM

## 2010-04-25 NOTE — Medication Information (Signed)
Summary: Order for Back Brace  Order for Back Brace   Imported By: Maryln Gottron 10/18/2009 15:28:18  _____________________________________________________________________  External Attachment:    Type:   Image     Comment:   External Document
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
## 2010-04-25 NOTE — Consult Note (Signed)
Summary: [facility]  [facility]   Imported By: Maryln Gottron 08/09/2009 12:33:39  _____________________________________________________________________  External Attachment:    Type:   Image     Comment:   External Document

## 2010-04-25 NOTE — Miscellaneous (Signed)
Summary: Orders Update  Clinical Lists Changes  Orders: Added new Test order of Arterial Duplex Lower Extremity (Arterial Duplex Low) - Signed 
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
## 2010-04-25 NOTE — Assessment & Plan Note (Signed)
Summary: pt/njr  Nurse Visit   Allergies: No Known Drug Allergies Laboratory Results   Blood Tests      INR: 1.4   (Normal Range: 0.88-1.12   Therap INR: 2.0-3.5) Comments: Rita Ohara  September 09, 2009 9:02 AM     Orders Added: 1)  Est. Patient Level I [11914] 2)  Protime [78295AO]   ANTICOAGULATION RECORD  NEW REGIMEN & LAB RESULTS Current INR: 1.4 Regimen: same  Repeat testing in: On Mon.  Anticoagulation Visit Questionnaire Coumadin dose missed/changed:  No Abnormal Bleeding Symptoms:  No  Any diet changes including alcohol intake, vegetables or greens since the last visit:  No Any illnesses or hospitalizations since the last visit:  No Any signs of clotting since the last visit (including chest discomfort, dizziness, shortness of breath, arm tingling, slurred speech, swelling or redness in leg):  No  MEDICATIONS BAYER ASPIRIN 325 MG  TABS (ASPIRIN)  MUCINEX 600 MG  TB12 (GUAIFENESIN) once daily SIMVASTATIN 80 MG TABS (SIMVASTATIN) 1 by mouth once daily MULTIVITAMINS   CAPS (MULTIPLE VITAMIN)  GLUCOTROL XL 2.5 MG  TB24 (GLIPIZIDE) 1 by mouth once daily. PROVENTIL HFA 108 (90 BASE) MCG/ACT AERS (ALBUTEROL SULFATE) 1-2 puffs   eevery 6 hours as needed for wheezing AMLODIPINE BESYLATE 10 MG TABS (AMLODIPINE BESYLATE) 1 by mouth once daily NITROFURANTOIN MACROCRYSTAL 50 MG CAPS (NITROFURANTOIN MACROCRYSTAL) 1 by mouth once daily FUROSEMIDE 20 MG TABS (FUROSEMIDE) 1 by mouth once daily CALCIUM CARBONATE-VITAMIN D 600-400 MG-UNIT  TABS (CALCIUM CARBONATE-VITAMIN D)  HYDRALAZINE HCL 25 MG TABS (HYDRALAZINE HCL) 1 by mouth two times a day MACROBID 100 MG CAPS (NITROFURANTOIN MONOHYD MACRO) 1 by mouth two times a day WARFARIN SODIUM 5 MG TABS (WARFARIN SODIUM) one by mouth once daily LOVENOX 80 MG/0.8ML SOLN (ENOXAPARIN SODIUM) 80 mg [place] two times a day for 5 days

## 2010-04-25 NOTE — Medication Information (Signed)
Summary: Order for Diabetic Testing Supplies  Order for Diabetic Testing Supplies   Imported By: Maryln Gottron 01/24/2010 09:16:34  _____________________________________________________________________  External Attachment:    Type:   Image     Comment:   External Document

## 2010-04-25 NOTE — Letter (Signed)
Summary: [facility]  [facility]   Imported By: Maryln Gottron 12/20/2009 10:16:13  _____________________________________________________________________  External Attachment:    Type:   Image     Comment:   External Document
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
## 2010-04-25 NOTE — Letter (Signed)
Summary: Eye Exam/[facility]  Eye Exam/[facility]   Imported By: Maryln Gottron 11/02/2009 11:15:23  _____________________________________________________________________  External Attachment:    Type:   Image     Comment:   External Document

## 2010-04-25 NOTE — Letter (Signed)
Summary: [facility]   Imported By: Maryln Gottron 06/06/2009 14:57:40  _____________________________________________________________________  External Attachment:    Type:   Image     Comment:   External Document

## 2010-04-25 NOTE — Miscellaneous (Signed)
Summary: Device preload  Clinical Lists Changes  Observations: Added new observation of PPM INDICATN: Brady (01/07/2010 14:32) Added new observation of MAGNET RTE: BOL 85 ERI 65 (01/07/2010 14:32) Added new observation of PPMLEADSTAT2: active (01/07/2010 14:32) Added new observation of PPMLEADSER2: 161096  (01/07/2010 14:32) Added new observation of PPMLEADMOD2: 4470  (01/07/2010 14:32) Added new observation of PPMLEADDOI2: 07/09/2003  (01/07/2010 14:32) Added new observation of PPMLEADLOC2: RV  (01/07/2010 14:32) Added new observation of PPMLEADSTAT1: active  (01/07/2010 14:32) Added new observation of PPMLEADSER1: 045409  (01/07/2010 14:32) Added new observation of PPMLEADMOD1: 4469  (01/07/2010 14:32) Added new observation of PPMLEADDOI1: 07/09/2003  (01/07/2010 14:32) Added new observation of PPMLEADLOC1: RA  (01/07/2010 14:32) Added new observation of PPM IMP MD: Duffy Rhody Tennant,MD  (01/07/2010 14:32) Added new observation of PPM DOI: 07/09/2003  (01/07/2010 14:32) Added new observation of PPM SERL#: [id number]  (01/07/2010 14:32) Added new observation of PPM MODL#: [id number]  (01/07/2010 08:65) Added new observation of PACEMAKERMFG: Medtronic  (01/07/2010 14:32) Added new observation of PPM REFER MD: Shaune Pascal, MD  (01/07/2010 14:32) Added new observation of PACEMAKER MD: Lewayne Bunting, MD  (01/07/2010 14:32)      PPM Specifications Following MD:  Lewayne Bunting, MD     Referring MD:  Shaune Pascal, MD PPM Vendor:  [employer]tronic     PPM Model Number:  [id number]     PPM Serial Number:  [id number] PPM DOI:  07/09/2003     PPM Implanting MD:  Rolla Plate  Lead 1    Location: RA     DOI: 07/09/2003     Model #: [id number]     Serial #: [id number]     Status: active Lead 2    Location: RV     DOI: 07/09/2003     Model #: 4470     Serial #: [id number]     Status: active  Magnet Response Rate:  BOL 85 ERI 65  Indications:  Huston Foley

## 2010-04-25 NOTE — Progress Notes (Signed)
Summary: Catateract surgery on 8/4 what to about coumadin  Phone Note Call from Patient Call back at Home Phone [phone]   Caller: Patient Summary of Call: Pt is suppose to have cataract surgery on 8/4 but is on coumadin and needs to know what do about this. Initial call taken by: Romualdo Bolk, CMA Duncan Dull),  October 21, 2009 10:45 AM  Follow-up for Phone Call        I rec  not stopping  the coumadin until 3 months after the clot was dx which would be  september 15th.   IF the PT is   in the right range  bleeding risk is low   if level is good  . But would  have Dr Veronia Beets opine on this also .  and would recheck the PT  the day before surgery  also.  Follow-up by: Madelin Headings MD,  October 22, 2009 12:16 PM  Additional Follow-up for Phone Call Additional follow up Details #1::        in regard to another subject  would rec get  full  doppler vascular artery evaluation of her LE before her follow up  with me in 2 months  .I  had mentioned  doing this before  and not emergent in timing  . Just to check out the artery blood flow in  both of her legs.   OK to  schedule this after her recovery surgery time . ( vascular referral)  Additional Follow-up by: Madelin Headings MD,  October 22, 2009 12:19 PM    Additional Follow-up for Phone Call Additional follow up Details #2::    LMTOCB Follow-up by: Romualdo Bolk, CMA Duncan Dull),  October 24, 2009 10:34 AM  Additional Follow-up for Phone Call Additional follow up Details #3:: Details for Additional Follow-up Action Taken: Pt aware and will call us to let know when her surgery is then we can schedule the protime the day before. I went ahead and put in the order for the doppler. We can schedule the follow up once we get the results from the doppler in 2 months. Pt is aware of all of this. Additional Follow-up by: Romualdo Bolk, CMA (AAMA),  October 25, 2009 2:18 PM

## 2010-04-25 NOTE — Medication Information (Signed)
Summary: Order for Diabetic Testing Supplies -[facility]  Order for Diabetic Testing Supplies -[facility]   Imported By: Maryln Gottron 01/04/2010 14:38:44  _____________________________________________________________________  External Attachment:    Type:   Image     Comment:   External Document
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
## 2010-04-25 NOTE — Letter (Signed)
Summary: Medical Necessity for Diabetic Testing Supplies  Medical Necessity for Diabetic Testing Supplies   Imported By: Maryln Gottron 02/03/2010 15:53:45  _____________________________________________________________________  External Attachment:    Type:   Image     Comment:   External Document
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
## 2010-04-25 NOTE — Letter (Signed)
Summary: [facility]   Imported By: Maryln Gottron 09/23/2009 14:42:23  _____________________________________________________________________  External Attachment:    Type:   Image     Comment:   External Document

## 2010-04-25 NOTE — Progress Notes (Signed)
  Phone Note From Other Clinic   Caller: dR  COOK   [place] Summary of Call: SMALL THROMBUS R PROX FEMORAL VEIN.  no chest pain, dyspnea, or pleuritic pain.  Pt to be started on Lovenox tonight (per Dr Donnetta Hutching, [facility]) and office f.u here tomorrow to further assess. Initial call taken by: Evelena Peat MD,  September 05, 2009 5:47 PM
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
## 2010-04-25 NOTE — Assessment & Plan Note (Signed)
Summary: pt//ccm  Nurse Visit   Allergies: No Known Drug Allergies Laboratory Results   Blood Tests   Date/Time Received: September 12, 2009 11:12 AM  Date/Time Reported: September 12, 2009 11:12 AM    INR: 2.6   (Normal Range: 0.88-1.12   Therap INR: 2.0-3.5) Comments: Wynona Canes, CMA  September 12, 2009 11:12 AM     Orders Added: 1)  Est. Patient Level I [99211] 2)  Protime [[id number]]  Laboratory Results   Blood Tests      INR: 2.6   (Normal Range: 0.88-1.12   Therap INR: 2.0-3.5) Comments: Wynona Canes, CMA  September 12, 2009 11:12 AM       ANTICOAGULATION RECORD PREVIOUS REGIMEN & LAB RESULTS   Previous INR:  1.4 on  09/09/2009  Previous Regimen:  same on  09/09/2009  NEW REGIMEN & LAB RESULTS Anticoag. Dx: V58.83,V58.61,453.41 Current INR Goal Range: 2.0-3.0 Current INR: 2.6 Current Coumadin Dose(mg): 5mg  qd Regimen: same  (no change)       Repeat testing in: Wednesday MEDICATIONS BAYER ASPIRIN 325 MG  TABS (ASPIRIN)  MUCINEX 600 MG  TB12 (GUAIFENESIN) once daily SIMVASTATIN 80 MG TABS (SIMVASTATIN) 1 by mouth once daily MULTIVITAMINS   CAPS (MULTIPLE VITAMIN)  GLUCOTROL XL 2.5 MG  TB24 (GLIPIZIDE) 1 by mouth once daily. PROVENTIL HFA 108 (90 BASE) MCG/ACT AERS (ALBUTEROL SULFATE) 1-2 puffs   eevery 6 hours as needed for wheezing AMLODIPINE BESYLATE 10 MG TABS (AMLODIPINE BESYLATE) 1 by mouth once daily NITROFURANTOIN MACROCRYSTAL 50 MG CAPS (NITROFURANTOIN MACROCRYSTAL) 1 by mouth once daily FUROSEMIDE 20 MG TABS (FUROSEMIDE) 1 by mouth once daily CALCIUM CARBONATE-VITAMIN D 600-400 MG-UNIT  TABS (CALCIUM CARBONATE-VITAMIN D)  HYDRALAZINE HCL 25 MG TABS (HYDRALAZINE HCL) 1 by mouth two times a day MACROBID 100 MG CAPS (NITROFURANTOIN MONOHYD MACRO) 1 by mouth two times a day WARFARIN SODIUM 5 MG TABS (WARFARIN SODIUM) one by mouth once daily LOVENOX 80 MG/0.8ML SOLN (ENOXAPARIN SODIUM) 80 mg [place] two times a day for 5 days   Anticoagulation Visit  Questionnaire      Coumadin dose missed/changed:  No      Abnormal Bleeding Symptoms:  No   Any diet changes including alcohol intake, vegetables or greens since the last visit:  No Any illnesses or hospitalizations since the last visit:  No Any signs of clotting since the last visit (including chest discomfort, dizziness, shortness of breath, arm tingling, slurred speech, swelling or redness in leg):  No

## 2010-04-25 NOTE — Letter (Signed)
Summary: [facility]   Imported By: Maryln Gottron 08/10/2009 11:07:24  _____________________________________________________________________  External Attachment:    Type:   Image     Comment:   External Document

## 2010-04-25 NOTE — Medication Information (Signed)
Summary: Diabetic Supplies  Diabetic Supplies   Imported By: Maryln Gottron 12/08/2009 13:53:34  _____________________________________________________________________  External Attachment:    Type:   Image     Comment:   External Document
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
## 2010-04-25 NOTE — Progress Notes (Signed)
  Phone Note Other Incoming   Summary of Call: call from radiology from [facility].   patient has a pacemaker thus  MRI not advised asking what to do .    Go ahead with doppler   Spoke with Son . will get  neurology consult .   however she is doing better now. and no new symptom .    Initial call taken by: Madelin Headings MD,  June 03, 2009 1:16 PM  Follow-up for Phone Call        order sent to pcc. Follow-up by: Romualdo Bolk, CMA (AAMA),  June 03, 2009 1:23 PM

## 2010-04-25 NOTE — Progress Notes (Signed)
Summary: called pt about Dr. Anne Hahn visit  Phone Note Outgoing Call Call back at [facility] Phone [phone]   Call placed by: Romualdo Bolk, CMA Duncan Dull),  July 22, 2009 1:44 PM Call placed to: Patient Summary of Call: Called pt to see what happened with the neurologist appt. I spoke to pt and she did go to the appt. She states that he didn't tell her anything.  They are suppose to call her back to schedule an appt. Initial call taken by: Romualdo Bolk, CMA Duncan Dull),  July 22, 2009 1:45 PM  Follow-up for Phone Call        I called Dr. Clarisa Kindred office and left a message for Stanton Kidney to fax over the last ov. Follow-up by: Romualdo Bolk, CMA Duncan Dull),  July 22, 2009 1:49 PM  Additional Follow-up for Phone Call Additional follow up Details #1::        I want ALL or summary evaluations   regarding to her  episode visual loss signs not just the last visit . Additional Follow-up by: Madelin Headings MD,  Jul 24, 2009 11:38 PM    Additional Follow-up for Phone Call Additional follow up Details #2::    I called again to Dr. Clarisa Kindred office and left a voicemail for Lupita Leash to fax over records.  We never got a response to the last call. Follow-up by: Romualdo Bolk, CMA Duncan Dull),  Aug 03, 2009 11:33 AM  Additional Follow-up for Phone Call Additional follow up Details #3:: Details for Additional Follow-up Action Taken: recieved note    and no intervention  done and fundi exam werer felt normal .  Additional Follow-up by: Madelin Headings MD,  Aug 04, 2009 2:24 PM

## 2010-04-25 NOTE — Medication Information (Signed)
Summary: Order for Diabetic Testing Supplies-Ammended  Order for Diabetic Testing Supplies-Ammended   Imported By: Maryln Gottron 12/22/2009 09:02:19  _____________________________________________________________________  External Attachment:    Type:   Image     Comment:   External Document

## 2010-04-25 NOTE — Letter (Signed)
Summary: [facility]  [facility]   Imported By: Maryln Gottron 05/12/2009 13:11:13  _____________________________________________________________________  External Attachment:    Type:   Image     Comment:   External Document

## 2010-04-25 NOTE — Letter (Signed)
Summary: [facility]  [facility]   Imported By: Maryln Gottron 09/05/2009 11:00:03  _____________________________________________________________________  External Attachment:    Type:   Image     Comment:   External Document

## 2010-04-25 NOTE — Miscellaneous (Signed)
Summary: labs done by [facility]  Clinical Lists Changes  Observations: Added new observation of HGB: 13.8 g/dL (91/47/8295 62:13) Added new observation of CREATININE: 2.38 mg/dL (08/65/7846 96:29)

## 2010-04-25 NOTE — Procedures (Signed)
Summary: pacer check/medtronic   Current Medications (verified): 1)  Mucinex 600 Mg  Tb12 (Guaifenesin) .... Once Daily As Needed 2)  Simvastatin 20 Mg Tabs (Simvastatin) .[name].. 1 By Mouth Once Daily 3)  Multivitamins   Caps (Multiple Vitamin) 4)  Glucotrol Xl 2.5 Mg  Tb24 (Glipizide) .[name].. 1 By Mouth Once Daily. 5)  Proventil Hfa 108 (90 Base) Mcg/act Aers (Albuterol Sulfate) .[name].. 1-2 Puffs   Eevery 6 Hours As Needed For Wheezing 6)  Amlodipine Besylate 10 Mg Tabs (Amlodipine Besylate) .[name].. 1 By Mouth Once Daily 7)  Nitrofurantoin Macrocrystal 50 Mg Caps (Nitrofurantoin Macrocrystal) .[name].. 1 By Mouth Once Daily 8)  Furosemide 20 Mg Tabs (Furosemide) .[name].. 1 By Mouth Once Daily 9)  Calcium Carbonate-Vitamin D 600-400 Mg-Unit  Tabs (Calcium Carbonate-Vitamin D) 10)  Hydralazine Hcl 25 Mg Tabs (Hydralazine Hcl) .[name].. 1 By Mouth Two Times A Day 11)  Warfarin Sodium 5 Mg Tabs (Warfarin Sodium) .... One By Mouth Once Daily  Allergies (verified): No Known Drug Allergies  PPM Specifications Following MD:  Lewayne Bunting, MD     Referring MD:  Shaune Pascal, MD PPM Vendor:  [employer]tronic     PPM Model Number:  [id number]     PPM Serial Number:  [id number] PPM DOI:  07/09/2003     PPM Implanting MD:  Rolla Plate  Lead 1    Location: RA     DOI: 07/09/2003     Model #: [id number]     Serial #: [id number]     Status: active Lead 2    Location: RV     DOI: 07/09/2003     Model #: [id number]     Serial #: [id number]     Status: active  Magnet Response Rate:  BOL 85 ERI 65  Indications:  Huston Foley   PPM Follow Up Remote Check?  No Battery Voltage:  2.74 V     Battery Est. Longevity:  3.5 years     Pacer Dependent:  No       PPM Device Measurements Atrium  Amplitude: 5.6 mV, Impedance: 428 ohms, Threshold: 0.75 V at 0.4 msec Right Ventricle  Amplitude: 22.40 mV, Impedance: 610 ohms, Threshold: 1.0 V at 0.4 msec  Episodes MS Episodes:  0     Percent Mode Switch:  0     Coumadin:  Yes Ventricular High Rate:  0     Atrial  Pacing:  20.3%     Ventricular Pacing:  2.0%  Parameters Mode:  DDDR     Lower Rate Limit:  60     Upper Rate Limit:  130 Paced AV Delay:  200     Sensed AV Delay:  180 Next Cardiology Appt Due:  06/25/2010 Tech Comments:  No parameter changes.  Device funtion normal.  ROV 6 months with Dr. Ladona Ridgel in [place]. Altha Harm, LPN  January 19, 2010 12:25 PM
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
## 2010-04-25 NOTE — Assessment & Plan Note (Signed)
Summary: ROA//alp   Vital Signs:  Patient profile:   76 year old female Menstrual status:  postmenopausal Weight:      165 pounds Pulse rate:   66 / minute BP sitting:   120 / 80  (left arm) Cuff size:   regular  Vitals Entered By: Romualdo Bolk, CMA (AAMA) (October 20, 2009 9:24 AM) CC: follow-up visit, Hypertension Management   History of Present Illness: [name] comes in today  for follow up of ED visit  for right ankle pain dx as a strain altthough nodiscrete injury . Xray no fracture and was given a brace that has helped but has kept it on continuousely since the visit. No edema and no numbness of her lewg with this  weight bearing ok now. No lab done per patient . She is a lot better.  KNots on abd are getting better nomore tenderness or bruiising.  on her coumadin.  on brand med  DM NO change  EYE:  MAy need cataract surgery  per Dr  Sharl Ma   Hypertension History:      She complains of neurologic problems, but denies headache, chest pain, palpitations, dyspnea with exertion, orthopnea, PND, peripheral edema, visual symptoms, syncope, and side effects from treatment.  She notes no problems with any antihypertensive medication side effects.  Hands get numb.        Positive major cardiovascular risk factors include female age 74 years old or older, diabetes, hyperlipidemia, and hypertension.  Negative major cardiovascular risk factors include non-tobacco-user status.        Positive history for target organ damage include peripheral vascular disease and renal insufficiency.      Preventive Screening-Counseling & Management  Alcohol-Tobacco     Alcohol drinks/day: 0     Smoking Status: quit     Year Quit: 1990's  Caffeine-Diet-Exercise     Caffeine use/day: 1-2     Does Patient Exercise: yes  Current Medications (verified): 1)  Mucinex 600 Mg  Tb12 (Guaifenesin) .... Once Daily 2)  Simvastatin 80 Mg Tabs (Simvastatin) .[name].. 1 By Mouth Once Daily 3)   Multivitamins   Caps (Multiple Vitamin) 4)  Glucotrol Xl 2.5 Mg  Tb24 (Glipizide) .[name].. 1 By Mouth Once Daily. 5)  Proventil Hfa 108 (90 Base) Mcg/act Aers (Albuterol Sulfate) .[name].. 1-2 Puffs   Eevery 6 Hours As Needed For Wheezing 6)  Amlodipine Besylate 10 Mg Tabs (Amlodipine Besylate) .[name].. 1 By Mouth Once Daily 7)  Nitrofurantoin Macrocrystal 50 Mg Caps (Nitrofurantoin Macrocrystal) .[name].. 1 By Mouth Once Daily 8)  Furosemide 20 Mg Tabs (Furosemide) .[name].. 1 By Mouth Once Daily 9)  Calcium Carbonate-Vitamin D 600-400 Mg-Unit  Tabs (Calcium Carbonate-Vitamin D) 10)  Hydralazine Hcl 25 Mg Tabs (Hydralazine Hcl) .[name].. 1 By Mouth Two Times A Day 11)  Warfarin Sodium 5 Mg Tabs (Warfarin Sodium) .... One By Mouth Once Daily  Allergies (verified): No Known Drug Allergies  Past History:  Past Medical History: Diabetes mellitus, type II Hyperlipidemia Hypertension proteinuria Renal insufficiency obesity chronic cough degenerative joint disease recurrent UTI's AAA enlarged thyroid DVT right leg pain  femoral vein 6/15 2011.   CONSULTANTS  [facility]  [facility]    Past History:  Care Management:  Dr. Kathrene Bongo    Urology: Kathrene Bongo and Rito Ehrlich Cardiology:Dr. Hurshel Party Othalmologist :   Charlotte Sanes   Physical Exam  General:  alert, well-developed, well-nourished, and well-hydrated.   Msk:  right foot  without deformity or ulcer or edema  and no tender  spots     midl discomfort at lateral ankle  no bruising.  Extremities:  no edema  pulse is good   right foot and warm   Neurologic:  alert & oriented X3 and gait normal.   Skin:  turgor normal, color normal, no ecchymoses, and no petechiae.     nodules still felt in abdomen [place] areas  but mobiel and non tender  Psych:  Oriented X3, good eye contact, not anxious appearing, and not depressed appearing.    Diabetes Management Exam:    Eye Exam:       Eye Exam done elsewhere          Date: 10/19/2009          Results: normal           Done by: Posey Rea of Name   Impression & Recommendations:  Problem # 1:  DVT (ICD-453.40) Assessment Improved no swelling     leg pain is gone     reviewed prev doppler     and ? of arterial insuff   mentinoned her pulse is good today  and no signs CT  and MRI is CI   Problem # 2:  ANKLE PAIN, RIGHT (ICD-719.47) treated as sprain and is much better   but caution with  ontinuous brace use   .  can wean off and do not use at night.   Problem # 3:  COUMADIN THERAPY (ICD-V58.61) on branded samples  Orders: Protime (16109UE) Fingerstick (45409)  Problem # 4:  MASS, LOCALIZED, SUPERFICIAL (ICD-782.2) Assessment: Improved from the lovenox     rechec as needed for this  Problem # 5:  HYPERTENSION (ICD-401.9)  Her updated medication list for this problem includes:    Amlodipine Besylate 10 Mg Tabs (Amlodipine besylate) .[name]... 1 by mouth once daily    Furosemide 20 Mg Tabs (Furosemide) .[name]... 1 by mouth once daily    Hydralazine Hcl 25 Mg Tabs (Hydralazine hcl) .[name]... 1 by mouth two times a day  BP today: 120/80 Prior BP: 130/80 (09/19/2009)  10 Yr Risk Heart Disease: 9 % Prior 10 Yr Risk Heart Disease: 11 % (05/11/2009)  Labs Reviewed: K+: 4.8 (05/11/2009) Creat: : 2.5 (05/11/2009)   Chol: 141 (05/11/2009)   HDL: 66.40 (05/11/2009)   LDL: 58 (05/11/2009)   TG: 82.0 (05/11/2009)  Complete Medication List: 1)  Mucinex 600 Mg Tb12 (Guaifenesin) .... Once daily 2)  Simvastatin 80 Mg Tabs (Simvastatin) .[name].. 1 by mouth once daily 3)  Multivitamins Caps (Multiple vitamin) 4)  Glucotrol Xl 2.5 Mg Tb24 (Glipizide) .[name].. 1 by mouth once daily. 5)  Proventil Hfa 108 (90 Base) Mcg/act Aers (Albuterol sulfate) .[name].. 1-2 puffs   eevery 6 hours as needed for wheezing 6)  Amlodipine Besylate 10 Mg Tabs (Amlodipine besylate) .[name].. 1 by mouth once daily 7)  Nitrofurantoin Macrocrystal 50 Mg Caps (Nitrofurantoin macrocrystal) .[name].. 1 by mouth once daily 8)  Furosemide 20 Mg Tabs (Furosemide) .[name].. 1  by mouth once daily 9)  Calcium Carbonate-vitamin D 600-400 Mg-unit Tabs (Calcium carbonate-vitamin d) 10)  Hydralazine Hcl 25 Mg Tabs (Hydralazine hcl) .[name].. 1 by mouth two times a day 11)  Warfarin Sodium 5 Mg Tabs (Warfarin sodium) .... One by mouth once daily  Hypertension Assessment/Plan:      The patient's hypertensive risk group is category C: Target organ damage and/or diabetes.  Her calculated 10 year risk of coronary heart disease is 9 %.  Today's blood pressure is 120/80.  Her blood pressure  goal is < 130/80.  Patient Instructions: 1)  take the  ankle support off at night  and use as needed.  2)    to avoid circulation problem. 3)  Continue MWF  5 mg  Coumadin  per day  4)  Tue Thurs Sat SUn  2.5 mg  mg pill per day  5)  recheck Protime in 3 week s .  6)  ROV   in 2 month .  Laboratory Results   Blood Tests   Date/Time Recieved: October 20, 2009 10:00 AM  Date/Time Reported: October 20, 2009 10:00 AM    INR: 3.0   (Normal Range: 0.88-1.12   Therap INR: 2.0-3.5) Comments: Wynona Canes, CMA  October 20, 2009 10:00 AM       ANTICOAGULATION RECORD PREVIOUS REGIMEN & LAB RESULTS Anticoagulation Diagnosis:  V58.83,V58.61,453.41 on  09/12/2009 Previous INR Goal Range:  2.0-3.0 on  09/12/2009 Previous INR:  3.6 on  10/07/2009 Previous Coumadin Dose(mg):  5mg  qd on  09/12/2009 Previous Regimen:  5mg . M, W, F all other days 2.5mg . on  10/07/2009  NEW REGIMEN & LAB RESULTS Current INR: 3.0 Current Coumadin Dose(mg): 5mg  on m,w,f 2.5mg  other days Regimen: 5mg . M, W, F all other days 2.5mg .  (no change)  MEDICATIONS MUCINEX 600 MG  TB12 (GUAIFENESIN) once daily SIMVASTATIN 80 MG TABS (SIMVASTATIN) 1 by mouth once daily MULTIVITAMINS   CAPS (MULTIPLE VITAMIN)  GLUCOTROL XL 2.5 MG  TB24 (GLIPIZIDE) 1 by mouth once daily. PROVENTIL HFA 108 (90 BASE) MCG/ACT AERS (ALBUTEROL SULFATE) 1-2 puffs   eevery 6 hours as needed for wheezing AMLODIPINE BESYLATE 10 MG TABS (AMLODIPINE  BESYLATE) 1 by mouth once daily NITROFURANTOIN MACROCRYSTAL 50 MG CAPS (NITROFURANTOIN MACROCRYSTAL) 1 by mouth once daily FUROSEMIDE 20 MG TABS (FUROSEMIDE) 1 by mouth once daily CALCIUM CARBONATE-VITAMIN D 600-400 MG-UNIT  TABS (CALCIUM CARBONATE-VITAMIN D)  HYDRALAZINE HCL 25 MG TABS (HYDRALAZINE HCL) 1 by mouth two times a day WARFARIN SODIUM 5 MG TABS (WARFARIN SODIUM) one by mouth once daily   Anticoagulation Visit Questionnaire      Coumadin dose missed/changed:  No      Abnormal Bleeding Symptoms:  No   Any diet changes including alcohol intake, vegetables or greens since the last visit:  No Any illnesses or hospitalizations since the last visit:  No Any signs of clotting since the last visit (including chest discomfort, dizziness, shortness of breath, arm tingling, slurred speech, swelling or redness in leg):  No

## 2010-04-27 NOTE — Assessment & Plan Note (Signed)
Summary: pt//ccm  Nurse Visit   Allergies: No Known Drug Allergies Laboratory Results   Blood Tests      INR: 2.1   (Normal Range: 0.88-1.12   Therap INR: 2.0-3.5) Comments: Rita Ohara  March 21, 2010 10:05 AM     Orders Added: 1)  Est. Patient Level I [99211] 2)  Protime [[id number]]   ANTICOAGULATION RECORD PREVIOUS REGIMEN & LAB RESULTS Anticoagulation Diagnosis:  V58.83,V58.61,453.41 on  09/12/2009 Previous INR Goal Range:  2.0-3.0 on  09/12/2009 Previous INR:  2.9 on  02/21/2010 Previous Coumadin Dose(mg):  5 mg on m,w,f,sat 2.5 other days on  02/03/2010 Previous Regimen:  same on  02/21/2010  NEW REGIMEN & LAB RESULTS Current INR: 2.1 Regimen: same  Repeat testing in: 4 weeks  Anticoagulation Visit Questionnaire Coumadin dose missed/changed:  No Abnormal Bleeding Symptoms:  No  Any diet changes including alcohol intake, vegetables or greens since the last visit:  No Any illnesses or hospitalizations since the last visit:  No Any signs of clotting since the last visit (including chest discomfort, dizziness, shortness of breath, arm tingling, slurred speech, swelling or redness in leg):  No  MEDICATIONS MUCINEX 600 MG  TB12 (GUAIFENESIN) once daily as needed SIMVASTATIN 20 MG TABS (SIMVASTATIN) 1 by mouth once daily MULTIVITAMINS   CAPS (MULTIPLE VITAMIN)  GLUCOTROL XL 2.5 MG  TB24 (GLIPIZIDE) 1 by mouth once daily. PROVENTIL HFA 108 (90 BASE) MCG/ACT AERS (ALBUTEROL SULFATE) 1-2 puffs   eevery 6 hours as needed for wheezing AMLODIPINE BESYLATE 10 MG TABS (AMLODIPINE BESYLATE) 1 by mouth once daily NITROFURANTOIN MACROCRYSTAL 50 MG CAPS (NITROFURANTOIN MACROCRYSTAL) 1 by mouth once daily FUROSEMIDE 20 MG TABS (FUROSEMIDE) 1 by mouth once daily CALCIUM CARBONATE-VITAMIN D 600-400 MG-UNIT  TABS (CALCIUM CARBONATE-VITAMIN D)  HYDRALAZINE HCL 25 MG TABS (HYDRALAZINE HCL) 1 by mouth two times a day WARFARIN SODIUM 5 MG TABS (WARFARIN SODIUM) one by mouth  once daily CIPRO 250 MG TABS (CIPROFLOXACIN HCL) 1 by mouth two times a day for 7 days

## 2010-04-27 NOTE — Progress Notes (Signed)
Summary: samples  Phone Note Call from Patient Call back at Home Phone [phone]   Reason for Call: Talk to Nurse Summary of Call: Patient would like to know if you have any samples she can have.  She does not have money for her meds.  thank you Initial call taken by: Everrett Coombe,  March 21, 2010 10:23 AM  Follow-up for Phone Call        Gave pt samples of Coumadin 5mg  Follow-up by: Romualdo Bolk, CMA Duncan Dull),  March 21, 2010 10:33 AM

## 2010-04-27 NOTE — Letter (Signed)
Summary: [facility]  [facility]   Imported By: Maryln Gottron 04/14/2010 11:04:55  _____________________________________________________________________  External Attachment:    Type:   Image     Comment:   External Document

## 2010-04-27 NOTE — Letter (Signed)
Summary: [facility]   Imported By: Maryln Gottron 03/29/2010 15:09:46  _____________________________________________________________________  External Attachment:    Type:   Image     Comment:   External Document

## 2010-05-19 ENCOUNTER — Telehealth: Payer: Self-pay | Admitting: Internal Medicine

## 2010-05-19 MED ORDER — WARFARIN SODIUM 5 MG PO TABS: 5.0000 mg | ORAL_TABLET | Freq: Every day | ORAL | Status: DC

## 2010-05-19 NOTE — Telephone Encounter (Signed)
Pt called and has questions re: Coumedin.  Pt has 1 pill lft and needs to know if she needs to continue on med or not? If so, pls call in to [facility]

## 2010-05-19 NOTE — Telephone Encounter (Signed)
Pt aware of this and will call back to schedule an protime appt.

## 2010-05-25 DIAGNOSIS — Z87828 Personal history of other (healed) physical injury and trauma: Secondary | ICD-10-CM

## 2010-05-25 HISTORY — DX: Personal history of other (healed) physical injury and trauma: Z87.828

## 2010-05-29 ENCOUNTER — Other Ambulatory Visit: Payer: Self-pay | Admitting: Internal Medicine

## 2010-06-09 ENCOUNTER — Emergency Department (HOSPITAL_COMMUNITY)
Admission: EM | Admit: 2010-06-09 | Discharge: 2010-06-09 | Disposition: A | Discharge status: Home / Self Care | Payer: PRIVATE HEALTH INSURANCE | Attending: Emergency Medicine | Admitting: Emergency Medicine

## 2010-06-09 ENCOUNTER — Emergency Department (HOSPITAL_COMMUNITY): Payer: PRIVATE HEALTH INSURANCE

## 2010-06-09 DIAGNOSIS — W1809XA Striking against other object with subsequent fall, initial encounter: Secondary | ICD-10-CM | POA: Insufficient documentation

## 2010-06-09 DIAGNOSIS — Z7901 Long term (current) use of anticoagulants: Secondary | ICD-10-CM | POA: Insufficient documentation

## 2010-06-09 DIAGNOSIS — I10 Essential (primary) hypertension: Secondary | ICD-10-CM | POA: Insufficient documentation

## 2010-06-09 DIAGNOSIS — Z79899 Other long term (current) drug therapy: Secondary | ICD-10-CM | POA: Insufficient documentation

## 2010-06-09 DIAGNOSIS — Z86718 Personal history of other venous thrombosis and embolism: Secondary | ICD-10-CM | POA: Insufficient documentation

## 2010-06-09 DIAGNOSIS — S0083XA Contusion of other part of head, initial encounter: Secondary | ICD-10-CM | POA: Insufficient documentation

## 2010-06-09 DIAGNOSIS — E119 Type 2 diabetes mellitus without complications: Secondary | ICD-10-CM | POA: Insufficient documentation

## 2010-06-09 DIAGNOSIS — S0003XA Contusion of scalp, initial encounter: Secondary | ICD-10-CM | POA: Insufficient documentation

## 2010-06-09 DIAGNOSIS — Y92009 Unspecified place in unspecified non-institutional (private) residence as the place of occurrence of the external cause: Secondary | ICD-10-CM | POA: Insufficient documentation

## 2010-06-09 LAB — BASIC METABOLIC PANEL
BUN: 31 mg/dL — ABNORMAL HIGH (ref 6–23)
CO2: 29 mEq/L (ref 19–32)
Calcium: 10.1 mg/dL (ref 8.4–10.5)
Chloride: 99 mEq/L (ref 96–112)
Creatinine, Ser: 2.2 mg/dL — ABNORMAL HIGH (ref 0.4–1.2)
Glucose, Bld: 109 mg/dL — ABNORMAL HIGH (ref 70–99)

## 2010-06-10 ENCOUNTER — Emergency Department (HOSPITAL_COMMUNITY)
Admission: EM | Admit: 2010-06-10 | Discharge: 2010-06-11 | Disposition: A | Discharge status: Other Acute Inpatient Hospital | Payer: Managed Care, Other (non HMO) | Source: Home / Self Care | Attending: Emergency Medicine | Admitting: Emergency Medicine

## 2010-06-10 ENCOUNTER — Emergency Department (HOSPITAL_COMMUNITY): Payer: Managed Care, Other (non HMO)

## 2010-06-10 DIAGNOSIS — I10 Essential (primary) hypertension: Secondary | ICD-10-CM | POA: Insufficient documentation

## 2010-06-10 DIAGNOSIS — E119 Type 2 diabetes mellitus without complications: Secondary | ICD-10-CM | POA: Insufficient documentation

## 2010-06-10 DIAGNOSIS — S0003XA Contusion of scalp, initial encounter: Secondary | ICD-10-CM | POA: Insufficient documentation

## 2010-06-10 DIAGNOSIS — R1110 Vomiting, unspecified: Secondary | ICD-10-CM | POA: Insufficient documentation

## 2010-06-10 DIAGNOSIS — Z86718 Personal history of other venous thrombosis and embolism: Secondary | ICD-10-CM | POA: Insufficient documentation

## 2010-06-10 DIAGNOSIS — IMO0002 Reserved for concepts with insufficient information to code with codable children: Secondary | ICD-10-CM | POA: Insufficient documentation

## 2010-06-10 DIAGNOSIS — R51 Headache: Secondary | ICD-10-CM | POA: Insufficient documentation

## 2010-06-10 DIAGNOSIS — Z950 Presence of cardiac pacemaker: Secondary | ICD-10-CM | POA: Insufficient documentation

## 2010-06-10 DIAGNOSIS — S065X0A Traumatic subdural hemorrhage without loss of consciousness, initial encounter: Secondary | ICD-10-CM | POA: Insufficient documentation

## 2010-06-10 DIAGNOSIS — Y929 Unspecified place or not applicable: Secondary | ICD-10-CM | POA: Insufficient documentation

## 2010-06-10 LAB — GLUCOSE, CAPILLARY: Glucose-Capillary: 102 mg/dL — ABNORMAL HIGH (ref 70–99)

## 2010-06-10 LAB — PROTIME-INR
INR: 3.85 — ABNORMAL HIGH (ref 0.00–1.49)
Prothrombin Time: 37.8 seconds — ABNORMAL HIGH (ref 11.6–15.2)

## 2010-06-10 LAB — BASIC METABOLIC PANEL
BUN: 40 mg/dL — ABNORMAL HIGH (ref 6–23)
CO2: 26 mEq/L (ref 19–32)
Chloride: 100 mEq/L (ref 96–112)
Glucose, Bld: 94 mg/dL (ref 70–99)
Potassium: 3.9 mEq/L (ref 3.5–5.1)
Sodium: 136 mEq/L (ref 135–145)

## 2010-06-10 LAB — DIFFERENTIAL
Basophils Absolute: 0 10*3/uL (ref 0.0–0.1)
Basophils Relative: 0 % (ref 0–1)
Eosinophils Relative: 1 % (ref 0–5)
Lymphocytes Relative: 12 % (ref 12–46)
Monocytes Absolute: 0.4 10*3/uL (ref 0.1–1.0)
Neutro Abs: 4.8 10*3/uL (ref 1.7–7.7)

## 2010-06-10 LAB — CBC
HCT: 40.4 % (ref 36.0–46.0)
Hemoglobin: 13.5 g/dL (ref 12.0–15.0)
MCHC: 33.4 g/dL (ref 30.0–36.0)
RDW: 13.8 % (ref 11.5–15.5)
WBC: 6 10*3/uL (ref 4.0–10.5)

## 2010-06-10 LAB — URINALYSIS, ROUTINE W REFLEX MICROSCOPIC
Bilirubin Urine: NEGATIVE
Ketones, ur: NEGATIVE mg/dL
Nitrite: NEGATIVE
Urobilinogen, UA: 0.2 mg/dL (ref 0.0–1.0)
pH: 6 (ref 5.0–8.0)

## 2010-06-11 ENCOUNTER — Inpatient Hospital Stay (HOSPITAL_COMMUNITY)
Admission: EM | Admit: 2010-06-11 | Discharge: 2010-06-13 | DRG: 086 | Disposition: A | Discharge status: Home Health | Payer: Managed Care, Other (non HMO) | Source: Other Acute Inpatient Hospital | Attending: Internal Medicine | Admitting: Internal Medicine

## 2010-06-11 DIAGNOSIS — S065XAA Traumatic subdural hemorrhage with loss of consciousness status unknown, initial encounter: Principal | ICD-10-CM | POA: Diagnosis present

## 2010-06-11 DIAGNOSIS — S065X9A Traumatic subdural hemorrhage with loss of consciousness of unspecified duration, initial encounter: Principal | ICD-10-CM | POA: Diagnosis present

## 2010-06-11 DIAGNOSIS — Z86718 Personal history of other venous thrombosis and embolism: Secondary | ICD-10-CM

## 2010-06-11 DIAGNOSIS — N183 Chronic kidney disease, stage 3 unspecified: Secondary | ICD-10-CM | POA: Diagnosis present

## 2010-06-11 DIAGNOSIS — Z950 Presence of cardiac pacemaker: Secondary | ICD-10-CM

## 2010-06-11 DIAGNOSIS — Y92009 Unspecified place in unspecified non-institutional (private) residence as the place of occurrence of the external cause: Secondary | ICD-10-CM

## 2010-06-11 DIAGNOSIS — I4430 Unspecified atrioventricular block: Secondary | ICD-10-CM | POA: Diagnosis present

## 2010-06-11 DIAGNOSIS — B961 Klebsiella pneumoniae [K. pneumoniae] as the cause of diseases classified elsewhere: Secondary | ICD-10-CM | POA: Diagnosis not present

## 2010-06-11 DIAGNOSIS — W19XXXA Unspecified fall, initial encounter: Secondary | ICD-10-CM | POA: Diagnosis present

## 2010-06-11 DIAGNOSIS — R55 Syncope and collapse: Secondary | ICD-10-CM | POA: Diagnosis present

## 2010-06-11 DIAGNOSIS — N390 Urinary tract infection, site not specified: Secondary | ICD-10-CM | POA: Diagnosis not present

## 2010-06-11 DIAGNOSIS — I129 Hypertensive chronic kidney disease with stage 1 through stage 4 chronic kidney disease, or unspecified chronic kidney disease: Secondary | ICD-10-CM | POA: Diagnosis present

## 2010-06-11 DIAGNOSIS — I5030 Unspecified diastolic (congestive) heart failure: Secondary | ICD-10-CM | POA: Diagnosis present

## 2010-06-11 LAB — CBC
Hemoglobin: 12.1 g/dL (ref 12.0–15.0)
MCH: 28.1 pg (ref 26.0–34.0)
MCHC: 32.9 g/dL (ref 30.0–36.0)
RDW: 14 % (ref 11.5–15.5)

## 2010-06-11 LAB — COMPREHENSIVE METABOLIC PANEL
Albumin: 3.8 g/dL (ref 3.5–5.2)
BUN: 35 mg/dL — ABNORMAL HIGH (ref 6–23)
Creatinine, Ser: 2.62 mg/dL — ABNORMAL HIGH (ref 0.4–1.2)
GFR calc Af Amer: 21 mL/min — ABNORMAL LOW (ref >60)
Glucose, Bld: 104 mg/dL — ABNORMAL HIGH (ref 70–99)
Potassium: 4.7 mEq/L (ref 3.5–5.1)
Sodium: 141 mEq/L (ref 135–145)
Total Bilirubin: 0.6 mg/dL (ref 0.3–1.2)

## 2010-06-11 LAB — GLUCOSE, CAPILLARY
Glucose-Capillary: 99 mg/dL (ref 70–99)
Glucose-Capillary: 77 mg/dL (ref 70–99)
Glucose-Capillary: 178 mg/dL — ABNORMAL HIGH (ref 70–99)
Glucose-Capillary: 214 mg/dL — ABNORMAL HIGH (ref 70–99)

## 2010-06-11 LAB — APTT: aPTT: 41 seconds — ABNORMAL HIGH (ref 24–37)

## 2010-06-11 LAB — PREPARE FRESH FROZEN PLASMA: Unit division: 0

## 2010-06-11 LAB — CARDIAC PANEL(CRET KIN+CKTOT+MB+TROPI)
CK, MB: 2.7 ng/mL (ref 0.3–4.0)
Relative Index: 1.8 (ref 0.0–2.5)
Relative Index: 1.9 (ref 0.0–2.5)
Total CK: 163 U/L (ref 7–177)
Total CK: 140 U/L (ref 7–177)
Troponin I: 0.05 ng/mL (ref 0.00–0.06)
Troponin I: 0.07 ng/mL — ABNORMAL HIGH (ref 0.00–0.06)

## 2010-06-11 LAB — MRSA PCR SCREENING: MRSA by PCR: NEGATIVE
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
## 2010-06-12 ENCOUNTER — Inpatient Hospital Stay (HOSPITAL_COMMUNITY): Payer: Managed Care, Other (non HMO)

## 2010-06-12 DIAGNOSIS — R55 Syncope and collapse: Secondary | ICD-10-CM

## 2010-06-12 DIAGNOSIS — I517 Cardiomegaly: Secondary | ICD-10-CM

## 2010-06-12 LAB — BASIC METABOLIC PANEL
BUN: 39 mg/dL — ABNORMAL HIGH (ref 6–23)
Chloride: 104 mEq/L (ref 96–112)
GFR calc Af Amer: 21 mL/min — ABNORMAL LOW (ref >60)
Potassium: 4.2 mEq/L (ref 3.5–5.1)
Sodium: 136 mEq/L (ref 135–145)

## 2010-06-12 LAB — GLUCOSE, CAPILLARY
Glucose-Capillary: 95 mg/dL (ref 70–99)
Glucose-Capillary: 127 mg/dL — ABNORMAL HIGH (ref 70–99)

## 2010-06-12 LAB — CBC
MCH: 28.9 pg (ref 26.0–34.0)
MCHC: 33.8 g/dL (ref 30.0–36.0)
MCV: 85.5 fL (ref 78.0–100.0)
Platelets: 134 10*3/uL — ABNORMAL LOW (ref 150–400)
RBC: 4.08 MIL/uL (ref 3.87–5.11)
RDW: 14 % (ref 11.5–15.5)

## 2010-06-12 LAB — PROTIME-INR: Prothrombin Time: 15.1 seconds (ref 11.6–15.2)

## 2010-06-14 LAB — URINE CULTURE
Colony Count: >=100000
Culture  Setup Time: 201203182016

## 2010-06-15 ENCOUNTER — Encounter: Payer: Self-pay | Admitting: Internal Medicine

## 2010-06-15 NOTE — Discharge Summary (Signed)
NAME:  [name], [name]               ACCOUNT NO.:  [id number]  MEDICAL RECORD NO.:  [id number]           PATIENT TYPE:  I  LOCATION:  3734                         FACILITY:  [facility]  PHYSICIAN:  Marinda Elk, M.D.DATE OF BIRTH:  1927-11-12  DATE OF ADMISSION:  06/11/2010 DATE OF DISCHARGE:  06/13/2010                              DISCHARGE SUMMARY   PRIMARY CARE DOCTOR:  [name]. Panosh, MD, with [facility].  DISCHARGE DIAGNOSES: 1. Subdural hematoma secondary to syncope. 2. Syncope. 3. Chronic kidney disease stage III. 4. Hypertension. 5. History of atrioventricular block. 6. History of deep vein thrombosis.  DISCHARGE MEDICATIONS:  Please refer to dictated medication list. Warfarin stop.  PROCEDURES PERFORMED:  CT head that showed broad-based right subdural hematoma largest in the temporal area in maximal thickness of 6.1 mm similar, but no significant change compared to prior study.  On June 10, 2010, CT head showed small acute right temporal subdural hematoma of a little bit more 6 mm thickness, no significant mass effect, small right tentorial and right frontal interhemispheric subdural component without mass effect, right frontal scalp hematoma, mild atrophy.  CT head June 09, 2010, showed no acute intracranial abnormality, mild atrophy and small-vessel disease.  C-spine x-ray, no sign of changes to lower cervical spondylosis with acute osseous abnormalities.  CONSULTANTS:  Hilda Lias, MD, Neurosurgery.  BRIEF ADMITTING HISTORY AND PHYSICAL:  This is an 77 year old African American female who lives by herself who came to the ED yesterday.  She had dyspnea at home and then had a syncopal episode in a fall.  She not able to tell me clearly if she had a fall, hit her head and had a syncope.  She could not tell him whether she had a syncope or she lost consciousness.  In any case, she did not have any chest pain.  She was evaluated in the ED and was sent back home  after she was back to her baseline.  She did not have any further syncopal episodes, however she noted to have bruise yesterday in the right side of her face and this prompted a CT scan of head yesterday which did not show any intracranial process.  So the patient was sent home without any further investigation today.  The patient started having nausea, vomiting, headache worsening. The pain is 9/10, so she decided to come back to the ED.  She was throwing up.  They did a CT scan, results above.  PHYSICAL EXAMINATION:  VITAL SIGNS:  Temperature 98, blood pressure 128/69.  After she got morphine, her blood pressure dropped to 70/50, now is back improved. GENERAL:  Elderly female in no distress.  She is in discomfort due to her pain, nausea. HEENT:  Her right side of the head is bruise, quite significant however. Pupils are equal, round and reactive. NECK:  Soft, supple.  No thyromegaly.  No supraclavicular or lymphadenopathy. LUNGS:  Good air movement.  Clear to auscultation. CARDIOVASCULAR:  She has a positive S1 and S2.  No murmurs, rubs or gallops. LUNGS:  She has good air movement and clear to auscultation. ABDOMEN:  Positive bowel sounds,  nontender, nondistended, soft. GU:  Deferred. MUSCULOSKELETAL:  No masses.  Good tone. NEUROLOGIC:  She is alert and oriented x3.  No focal neurological deficits are present. SKIN:  Does not reveal rashes.  LABORATORY FINDINGS:  On admission shows sodium 137, potassium 4.5, chloride 99, bicarb of 29, glucose of 109, BUN of 31, creatinine 2.2. Her white count is 6.6, hemoglobin of 13, platelet count 147, ANC of 4.8.  Her INR is 3.5.  Her UA showed moderate leukocytes.  Microscopy, many bacteria, too numerous white blood cells count.  EKG shows normal sinus rhythm with left ventricular hypertrophy.  ASSESSMENT AND PLAN: 1. Subdural hematoma secondary to syncope.  Neurosurgery was     consulted.  They recommended to give FFP and vitamin K.   She was     reversed.  She was nonfocal.  Her symptoms of nausea and vomiting     resolved.  The patient was watched overnight in step-down with no     events.  She was transferred to Telemetry.  She did not have any     nausea or unsteadiness on her gait.  Physical Therapy saw her.  The     patient was totally independent with no help.  The patient     continued to do quite well.  She remains nonfocal, she was turned     in stable condition, so she was taken off Coumadin. 2. Syncope.  She was watched on telemetry with no events.  She     remained nonfocal.  Echo showed diastolic 1 heart failure with a PA     pressure of 35 and no aortic stenosis.  Carotid Doppler was     negative.  Interrogation of her pacemaker is done showed no events.     Unclear etiology of her syncope.  She will follow up with     her cardiologist as an outpatient. 3. Chronic kidney disease, currently stable.  No changes were made. 4. Hypertension, currently well controlled.  She was not orthostatic     on admission.  She will continue her home meds. 5. History of atrioventricular block, continues to be pacer, will     follow up with her cardiologist and interrogate at that time too. 6. History of deep vein thrombosis.  She is on Coumadin more than 6     months.  She has no hypercoagulable state, she has a unknown cause     for her PE, unknown cause for her DVT, so she was taken off     Coumadin. 7. Disposition.  The patient will follow up with her cardiologist this     week.  Here we will see how her blood pressure is doing, we will     also interrogate her pacemaker and titrate blood pressure     medications if needed.  She will also follow up with her primary     care doctor in 2 weeks.  Vitals on day of discharge shows temperature 97, pulse 67, respirations 18, blood pressure 124/76, O2 sat 95% on room air.  Labs on day of discharge shows none.     Marinda Elk, M.D.     AF/MEDQ  D:   06/13/2010  T:  06/14/2010  Job:  [id number]  cc:   Vesta Mixer, M.D. [name]. [name], MD  Electronically Signed by Lambert Keto M.D. on 06/15/2010 08:16:53 AM

## 2010-06-16 NOTE — Consult Note (Signed)
  NAME:  [name], [name]               ACCOUNT NO.:  [id number]  MEDICAL RECORD NO.:  [id number]           PATIENT TYPE:  I  LOCATION:  3734                         FACILITY:  [facility]  PHYSICIAN:  Hilda Lias, M.D.   DATE OF BIRTH:  1927-10-24  DATE OF CONSULTATION:  06/11/2010 DATE OF DISCHARGE:                                CONSULTATION   HISTORY OF PRESENT ILLNESS:  [name] is a lady who fell at home on March 16.  She hit her head, she was seen at [facility] Room, a CT scan was done which was negative.  Yesterday she went back again to the hospital complaining of increased headache.  CT scan was obtained, we were called.  The telephone call was about 9:30 last night and took at least 4 hours for the patient to be transferred to [facility].  Right now the patient is awake and oriented x3.  She is able to tell me all of the history.  She has a history of DVT on Coumadin. There is no history of any loss of consciousness.  Right now she is doing really well.  PAST MEDICAL HISTORY:  Significant for she has a cholecystectomy.  She also has a pacemaker.  PHYSICAL EXAMINATION:  VITAL SIGNS:  Pulse of 86, blood pressure 110/82. GENERAL:  She is oriented x3. HEAD, EARS, NOSE, AND THROAT:  There is swelling of the right side of the face.  There is no blood or CSF coming from the ear or from the nose. NECK:  She has full flexibility. MENTAL STATUS:  Oriented x3. CRANIAL NERVES:  Pupils equal and reactive.  She has full movements. Face symmetric except the swelling of the right side of the eye. Strength is normal in the upper and lower extremities.  Reflexes symmetrical.  Sensation normal.  The CT scan done on March 16 was essentially negative.  The CT scan yesterday showed that she has a tiny subdural hematoma in the right side with some blood in the falx cerebri. There is no evidence of any shift.  There is no edema.  All the structure in the midline.  CLINICAL  IMPRESSION:  Closed-head injury.  Coumadin therapy because of DVT with INR over 3.5.  RECOMMENDATIONS:  The patient was admitted by the Hospitalist.  She is getting fresh frozen plasma at the present time.  We are going to monitor to see how well she does.  Right now CT scan does show some blood, there is no any surgical lesion.  We will repeat the CT scan of the head in the next 24-48 hour or before as needed.  At the present meantime, more medical than surgical.          ______________________________ Hilda Lias, M.D.     EB/MEDQ  D:  06/11/2010  T:  06/11/2010  Job:  [id number]  Electronically Signed by Hilda Lias M.D. on 06/16/2010 02:38:01 PM

## 2010-06-18 LAB — URINALYSIS, ROUTINE W REFLEX MICROSCOPIC
Ketones, ur: NEGATIVE mg/dL
Protein, ur: 100 mg/dL — AB
Urobilinogen, UA: 0.2 mg/dL (ref 0.0–1.0)

## 2010-06-18 LAB — CBC
HCT: 39.9 % (ref 36.0–46.0)
Hemoglobin: 13.3 g/dL (ref 12.0–15.0)
MCV: 88.1 fL (ref 78.0–100.0)
Platelets: 152 10*3/uL (ref 150–400)
WBC: 4.6 10*3/uL (ref 4.0–10.5)

## 2010-06-18 LAB — COMPREHENSIVE METABOLIC PANEL
Albumin: 3.8 g/dL (ref 3.5–5.2)
Alkaline Phosphatase: 66 U/L (ref 39–117)
BUN: 31 mg/dL — ABNORMAL HIGH (ref 6–23)
Chloride: 103 mEq/L (ref 96–112)
Creatinine, Ser: 2.7 mg/dL — ABNORMAL HIGH (ref 0.4–1.2)
GFR calc non Af Amer: 17 mL/min — ABNORMAL LOW (ref >60)
Glucose, Bld: 143 mg/dL — ABNORMAL HIGH (ref 70–99)
Potassium: 4 mEq/L (ref 3.5–5.1)
Total Bilirubin: 0.4 mg/dL (ref 0.3–1.2)

## 2010-06-18 LAB — DIFFERENTIAL
Basophils Absolute: 0 10*3/uL (ref 0.0–0.1)
Basophils Relative: 0 % (ref 0–1)
Lymphocytes Relative: 19 % (ref 12–46)
Neutro Abs: 3.1 10*3/uL (ref 1.7–7.7)
Neutrophils Relative %: 67 % (ref 43–77)

## 2010-06-18 LAB — URINE CULTURE: Colony Count: >=100000

## 2010-06-18 LAB — URINE MICROSCOPIC-ADD ON

## 2010-06-20 ENCOUNTER — Ambulatory Visit (INDEPENDENT_AMBULATORY_CARE_PROVIDER_SITE_OTHER): Payer: PRIVATE HEALTH INSURANCE | Admitting: Internal Medicine

## 2010-06-20 ENCOUNTER — Encounter: Payer: Self-pay | Admitting: Internal Medicine

## 2010-06-20 VITALS — BP 100/60 | HR 78 | Wt 165.0 lb

## 2010-06-20 DIAGNOSIS — M549 Dorsalgia, unspecified: Secondary | ICD-10-CM

## 2010-06-20 DIAGNOSIS — N259 Disorder resulting from impaired renal tubular function, unspecified: Secondary | ICD-10-CM

## 2010-06-20 DIAGNOSIS — I6200 Nontraumatic subdural hemorrhage, unspecified: Secondary | ICD-10-CM

## 2010-06-20 DIAGNOSIS — M79609 Pain in unspecified limb: Secondary | ICD-10-CM

## 2010-06-20 DIAGNOSIS — E119 Type 2 diabetes mellitus without complications: Secondary | ICD-10-CM

## 2010-06-20 DIAGNOSIS — N390 Urinary tract infection, site not specified: Secondary | ICD-10-CM

## 2010-06-20 DIAGNOSIS — Z87898 Personal history of other specified conditions: Secondary | ICD-10-CM

## 2010-06-20 DIAGNOSIS — S065X9A Traumatic subdural hemorrhage with loss of consciousness of unspecified duration, initial encounter: Secondary | ICD-10-CM

## 2010-06-20 DIAGNOSIS — Z950 Presence of cardiac pacemaker: Secondary | ICD-10-CM

## 2010-06-20 LAB — POCT URINALYSIS DIPSTICK
Bilirubin, UA: NEGATIVE
Blood, UA: NEGATIVE
Ketones, UA: NEGATIVE
Leukocytes, UA: NEGATIVE
Spec Grav, UA: 1.01
pH, UA: 5

## 2010-06-20 NOTE — Progress Notes (Signed)
Subjective:    Patient ID: [name], female    DOB: 03/21/1928, 77 y.o.   MRN: [id number]  HPI Patient comes in today with was scheduled as a follow-up routine phys ed but much has happened this month. She had an episode of loss of consciousness on 3 18 12   that resulted in day fall. She felt  it coming on then got  Up to get some water and then woke up on the floor.    Had urinated on floor.  She proceeded to  cleaned it up.   Then went  To the ED same day after calling son  .Pattricia Boss penn ) Saw her and didn't find anything and was sent home    The next day  She had some headache and a large knot on her head. Her son took her back to the emergency room. At that time she had a CT scan which showed a subdural hematoma there were related to symptoms of nausea and vomiting she was treated with FFP and vitamin K and her Coumadin was stopped Her syncope was evaluated with an echocardiogram that showed diastolic heart failure1 a PA pressure of 35 and negative carotid Doppler and negative interrogated pacer.  Since that time she's had no problems with headache or bleeding or specific weakness however she is having great difficulty with pain in her right lower back that radiates to her right leg. She's having difficulty walking because of what she says is the pain and not weakness. She is had physical therapy come to the house a couple times so far. She thinks she hurt her back when she fell.  She describes her pain as shooting pains down her leg that are unpredictable.  DM  Stable without hypoglycemia UTI  New recent burning or increased urination CRD  No change in her creatinine recently HX of dvt  More than  Six months on any coagulation therapy.   PMHX, soc, and fam hx reviewed  .Review of Systems No chest pain shortness of breath currently no increased swelling edema  Or bleeding.      Objective:   Physical Exam Well-developed well-nourished walks carefully with the cane is oriented and  articulate at this time. No bruising noted next couple without masses chest CT ABS equal :     cardiac  No gallops or murmurs her perfusion is normal. Neuro  No obv focal weakness  limp related to right leg and tender right lower si area  . No foot drop .  Reviewed Hosp note  Dc summary  3 20 12.        Assessment & Plan:  S/p  Subdural after syncope on coumadin No obv focal deficits  But ambulation seems to be more impaired by back and leg pain on right.    Unclear cause of the syncope but felt it coming on and had incontinence .    Hx of utis   Urine clear today  DM stable CRF stable Hx of DVT  Agree with dc of coumadin .   Discussed with patient and her son [name] about next step. It appears that the pain is the most interfering   Disability at present and has affected her mobility .  Can use extra strength tylenol 3 x per day if needed in the short run.  We will refer to get help with this .  Then  Decide on follow up    Total visit 45 mins > 50% spent  counseling and coordinating care

## 2010-06-20 NOTE — Patient Instructions (Signed)
Stay off the coumadin   Will arrange for you to see an ortho  Or her neurosurg consult. I think this is  The problem with you getting around. Keep a cell phone on you  Or alert button to be get able to help if you have a fall.  return office visit in 1-2 months or prn.

## 2010-06-25 DIAGNOSIS — S065XAA Traumatic subdural hemorrhage with loss of consciousness status unknown, initial encounter: Secondary | ICD-10-CM

## 2010-06-25 DIAGNOSIS — Z87898 Personal history of other specified conditions: Secondary | ICD-10-CM | POA: Insufficient documentation

## 2010-06-25 DIAGNOSIS — S065X9A Traumatic subdural hemorrhage with loss of consciousness of unspecified duration, initial encounter: Secondary | ICD-10-CM | POA: Insufficient documentation

## 2010-06-25 DIAGNOSIS — M549 Dorsalgia, unspecified: Secondary | ICD-10-CM | POA: Insufficient documentation

## 2010-06-25 HISTORY — DX: Traumatic subdural hemorrhage with loss of consciousness status unknown, initial encounter: S06.5XAA

## 2010-06-25 HISTORY — DX: Traumatic subdural hemorrhage with loss of consciousness of unspecified duration, initial encounter: S06.5X9A

## 2010-06-26 ENCOUNTER — Emergency Department (HOSPITAL_COMMUNITY): Payer: Medicare HMO

## 2010-06-26 ENCOUNTER — Inpatient Hospital Stay (HOSPITAL_COMMUNITY)
Admission: EM | Admit: 2010-06-26 | Discharge: 2010-06-29 | DRG: 312 | Disposition: A | Discharge status: Home Health | Payer: Medicare HMO | Attending: Internal Medicine | Admitting: Internal Medicine

## 2010-06-26 ENCOUNTER — Inpatient Hospital Stay (HOSPITAL_COMMUNITY): Payer: Medicare HMO

## 2010-06-26 DIAGNOSIS — N183 Chronic kidney disease, stage 3 unspecified: Secondary | ICD-10-CM | POA: Diagnosis present

## 2010-06-26 DIAGNOSIS — I6529 Occlusion and stenosis of unspecified carotid artery: Secondary | ICD-10-CM | POA: Diagnosis present

## 2010-06-26 DIAGNOSIS — E785 Hyperlipidemia, unspecified: Secondary | ICD-10-CM | POA: Diagnosis present

## 2010-06-26 DIAGNOSIS — J4489 Other specified chronic obstructive pulmonary disease: Secondary | ICD-10-CM | POA: Diagnosis present

## 2010-06-26 DIAGNOSIS — M5430 Sciatica, unspecified side: Secondary | ICD-10-CM | POA: Diagnosis present

## 2010-06-26 DIAGNOSIS — Z950 Presence of cardiac pacemaker: Secondary | ICD-10-CM

## 2010-06-26 DIAGNOSIS — E871 Hypo-osmolality and hyponatremia: Secondary | ICD-10-CM | POA: Diagnosis not present

## 2010-06-26 DIAGNOSIS — I951 Orthostatic hypotension: Principal | ICD-10-CM | POA: Diagnosis present

## 2010-06-26 DIAGNOSIS — I129 Hypertensive chronic kidney disease with stage 1 through stage 4 chronic kidney disease, or unspecified chronic kidney disease: Secondary | ICD-10-CM | POA: Diagnosis present

## 2010-06-26 DIAGNOSIS — G5790 Unspecified mononeuropathy of unspecified lower limb: Secondary | ICD-10-CM | POA: Diagnosis present

## 2010-06-26 DIAGNOSIS — E119 Type 2 diabetes mellitus without complications: Secondary | ICD-10-CM | POA: Diagnosis present

## 2010-06-26 DIAGNOSIS — J449 Chronic obstructive pulmonary disease, unspecified: Secondary | ICD-10-CM | POA: Diagnosis present

## 2010-06-26 LAB — URINALYSIS, ROUTINE W REFLEX MICROSCOPIC
Bilirubin Urine: NEGATIVE
Nitrite: NEGATIVE
Protein, ur: 100 mg/dL — AB
Specific Gravity, Urine: 1.015 (ref 1.005–1.030)
Urobilinogen, UA: 0.2 mg/dL (ref 0.0–1.0)

## 2010-06-26 LAB — COMPREHENSIVE METABOLIC PANEL
Alkaline Phosphatase: 61 U/L (ref 39–117)
BUN: 30 mg/dL — ABNORMAL HIGH (ref 6–23)
Glucose, Bld: 94 mg/dL (ref 70–99)
Potassium: 4.4 mEq/L (ref 3.5–5.1)
Total Bilirubin: 0.9 mg/dL (ref 0.3–1.2)
Total Protein: 7.6 g/dL (ref 6.0–8.3)

## 2010-06-26 LAB — DIFFERENTIAL
Eosinophils Relative: 1 % (ref 0–5)
Lymphocytes Relative: 14 % (ref 12–46)
Lymphs Abs: 0.8 10*3/uL (ref 0.7–4.0)
Monocytes Relative: 5 % (ref 3–12)

## 2010-06-26 LAB — PROTIME-INR
INR: 1.53 — ABNORMAL HIGH (ref 0.00–1.49)
Prothrombin Time: 18.6 seconds — ABNORMAL HIGH (ref 11.6–15.2)

## 2010-06-26 LAB — CBC
HCT: 39.3 % (ref 36.0–46.0)
MCV: 85.8 fL (ref 78.0–100.0)
RDW: 13.8 % (ref 11.5–15.5)
WBC: 5.7 10*3/uL (ref 4.0–10.5)

## 2010-06-27 DIAGNOSIS — R9431 Abnormal electrocardiogram [ECG] [EKG]: Secondary | ICD-10-CM

## 2010-06-27 LAB — GLUCOSE, CAPILLARY
Glucose-Capillary: 95 mg/dL (ref 70–99)
Glucose-Capillary: 61 mg/dL — ABNORMAL LOW (ref 70–99)
Glucose-Capillary: 130 mg/dL — ABNORMAL HIGH (ref 70–99)

## 2010-06-27 LAB — DIFFERENTIAL
Lymphs Abs: 0.8 10*3/uL (ref 0.7–4.0)
Monocytes Relative: 9 % (ref 3–12)
Neutro Abs: 3.6 10*3/uL (ref 1.7–7.7)
Neutrophils Relative %: 72 % (ref 43–77)

## 2010-06-27 LAB — CBC
Hemoglobin: 12.1 g/dL (ref 12.0–15.0)
MCH: 27.9 pg (ref 26.0–34.0)
MCV: 85.7 fL (ref 78.0–100.0)
RBC: 4.34 MIL/uL (ref 3.87–5.11)

## 2010-06-27 LAB — COMPREHENSIVE METABOLIC PANEL
AST: 17 U/L (ref 0–37)
Alkaline Phosphatase: 52 U/L (ref 39–117)
BUN: 32 mg/dL — ABNORMAL HIGH (ref 6–23)
CO2: 25 mEq/L (ref 19–32)
Chloride: 107 mEq/L (ref 96–112)
Creatinine, Ser: 2.34 mg/dL — ABNORMAL HIGH (ref 0.4–1.2)
GFR calc Af Amer: 24 mL/min — ABNORMAL LOW (ref >60)
GFR calc non Af Amer: 20 mL/min — ABNORMAL LOW (ref >60)
Potassium: 3.8 mEq/L (ref 3.5–5.1)
Total Bilirubin: 0.6 mg/dL (ref 0.3–1.2)

## 2010-06-27 LAB — CARDIAC PANEL(CRET KIN+CKTOT+MB+TROPI): Relative Index: INVALID (ref 0.0–2.5)

## 2010-06-27 LAB — CK TOTAL AND CKMB (NOT AT ARMC)
CK, MB: 1.5 ng/mL (ref 0.3–4.0)
Relative Index: INVALID (ref 0.0–2.5)
Relative Index: INVALID (ref 0.0–2.5)

## 2010-06-27 LAB — TSH: TSH: 1.193 u[IU]/mL (ref 0.350–4.500)

## 2010-06-28 DIAGNOSIS — I517 Cardiomegaly: Secondary | ICD-10-CM

## 2010-06-28 LAB — GLUCOSE, CAPILLARY
Glucose-Capillary: 59 mg/dL — ABNORMAL LOW (ref 70–99)
Glucose-Capillary: 98 mg/dL (ref 70–99)

## 2010-06-28 LAB — CARDIAC PANEL(CRET KIN+CKTOT+MB+TROPI)
Relative Index: INVALID (ref 0.0–2.5)
Relative Index: INVALID (ref 0.0–2.5)
Total CK: 77 U/L (ref 7–177)
Total CK: 76 U/L (ref 7–177)
Troponin I: 0.04 ng/mL (ref 0.00–0.06)

## 2010-06-28 LAB — BASIC METABOLIC PANEL
BUN: 33 mg/dL — ABNORMAL HIGH (ref 6–23)
CO2: 26 mEq/L (ref 19–32)
Calcium: 8.9 mg/dL (ref 8.4–10.5)
Creatinine, Ser: 2.21 mg/dL — ABNORMAL HIGH (ref 0.4–1.2)
GFR calc non Af Amer: 21 mL/min — ABNORMAL LOW (ref >60)
Glucose, Bld: 178 mg/dL — ABNORMAL HIGH (ref 70–99)
Sodium: 129 mEq/L — ABNORMAL LOW (ref 135–145)

## 2010-06-28 LAB — URINE CULTURE
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
## 2010-06-29 LAB — DIFFERENTIAL
Basophils Relative: 1 % (ref 0–1)
Eosinophils Absolute: 0.2 10*3/uL (ref 0.0–0.7)
Neutrophils Relative %: 62 % (ref 43–77)

## 2010-06-29 LAB — CBC
Platelets: 149 10*3/uL — ABNORMAL LOW (ref 150–400)
RBC: 4.14 MIL/uL (ref 3.87–5.11)
WBC: 4 10*3/uL (ref 4.0–10.5)

## 2010-06-29 LAB — COMPREHENSIVE METABOLIC PANEL
AST: 18 U/L (ref 0–37)
Albumin: 3.3 g/dL — ABNORMAL LOW (ref 3.5–5.2)
Alkaline Phosphatase: 44 U/L (ref 39–117)
Chloride: 104 mEq/L (ref 96–112)
GFR calc Af Amer: 31 mL/min — ABNORMAL LOW (ref >60)
Potassium: 4.3 mEq/L (ref 3.5–5.1)
Sodium: 139 mEq/L (ref 135–145)
Total Bilirubin: 0.4 mg/dL (ref 0.3–1.2)

## 2010-06-29 LAB — GLUCOSE, CAPILLARY

## 2010-06-30 LAB — GLUCOSE, CAPILLARY

## 2010-07-01 LAB — DIFFERENTIAL
Basophils Relative: 1 % (ref 0–1)
Eosinophils Absolute: 0.2 10*3/uL (ref 0.0–0.7)
Eosinophils Relative: 4 % (ref 0–5)
Monocytes Absolute: 0.6 10*3/uL (ref 0.1–1.0)
Monocytes Relative: 13 % — ABNORMAL HIGH (ref 3–12)
Neutrophils Relative %: 59 % (ref 43–77)

## 2010-07-01 LAB — BRAIN NATRIURETIC PEPTIDE: Pro B Natriuretic peptide (BNP): 152 pg/mL — ABNORMAL HIGH (ref 0.0–100.0)

## 2010-07-01 LAB — CBC
Hemoglobin: 12.9 g/dL (ref 12.0–15.0)
RBC: 4.34 MIL/uL (ref 3.87–5.11)
RDW: 14.3 % (ref 11.5–15.5)

## 2010-07-01 LAB — COMPREHENSIVE METABOLIC PANEL
ALT: 14 U/L (ref 0–35)
AST: 23 U/L (ref 0–37)
Alkaline Phosphatase: 71 U/L (ref 39–117)
Glucose, Bld: 53 mg/dL — ABNORMAL LOW (ref 70–99)
Potassium: 4 mEq/L (ref 3.5–5.1)
Sodium: 140 mEq/L (ref 135–145)
Total Protein: 6.8 g/dL (ref 6.0–8.3)

## 2010-07-01 NOTE — Discharge Summary (Signed)
NAME:  [name], [name]               ACCOUNT NO.:  [id number]  MEDICAL RECORD NO.:  [id number]           PATIENT TYPE:  I  LOCATION:  A311                          FACILITY:  [facility]  PHYSICIAN:  Rosanna Randy, MDDATE OF BIRTH:  [birth year]  DATE OF ADMISSION:  06/26/2010 DATE OF DISCHARGE:  04/05/2012LH                              DISCHARGE SUMMARY   PRIMARY CARE PHYSICIAN:  [name]. Panosh, MD  DISCHARGE DIAGNOSES: 1. Orthostatic syncope. 2. Hypertension. 3. Diabetes. 4. Hyperlipidemia. 5. Chronic kidney disease, stage III. 6. Pseudohyponatremia due to elevated blood sugar. 7. Atrioventricular  block status post pacemaker. 8. History of aortic abdominal aneurysm status post repair in 2005. 9. Leg pain secondary to sciatica and neuropathy. 10.History of recurrent urinary tract infection, on suppression     antibiotic therapy. 11.History of mild chronic obstructive pulmonary disease. 12.Carotid stenosis.  DISCHARGE MEDICATIONS: 1. Tylenol 325 mg tablets, to take 2 tablets by mouth every 4 hours as     needed for pain. 2. Amlodipine 5 mg 1 tablet by mouth daily. 3. Tramadol 50 mg 1 tablet by mouth every 8 hours as needed for severe     pain, not relieved by Tylenol. 4. Calcium carbonate and vitamin D 1 tablet by mouth daily. 5. Glucotrol 2.5 mg 1 tablet by mouth daily. 6. Hydralazine 25 mg 1 tablet by mouth twice a day. 7. Mucinex 600 mg 1 tablet by mouth daily. 8. Multivitamins over the counter 1 tablet by mouth daily. 9. Nitrofurantoin 50 mg 1 capsule by mouth daily. 10.Proventil albuterol inhaler 1-2 puffs inhale every 6 hours as     needed for wheezing and shortness of breath. 11.Simvastatin 20 mg 1 tablet by mouth daily. 12.The patient had been advised to stop her Lasix and we have also     decreased her Norvasc to just 5 mg.  DISPOSITION AND FOLLOWUP:  The patient had been discharged in stable improved condition, currently not having any further syncopal events  and not complaining of any shortness of breath, chest pain, abdominal pain, or any other discomfort.  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  During that appointment, it is going to be important to reevaluate the patient's conditions and also blood pressure since her medications have been adjusted during this hospitalization.  The patient was also complaining at the moment of discharge of a mild intermittent lower extremity pain radiated from her lower back into her legs which sounds to be most likely due to sciatica.  X-ray of her lower spine failed to demonstrate any acute abnormality and there was just some degenerative disk and joint disease in her lower spine.  This is something that may need to be followed by the patient's primary care physician and determine if further workup or a different treatment approach is required.  The patient had been instructed to use Tylenol for her pain and when the pain is too severe and relieved by Tylenol, to take tramadol.  The patient had a prior history of neuropathy and is currently not taking  any medication for that, it is something that may need to be reevaluated.  The patient denies any numbness.  PROCEDURE PERFORMED DURING THIS HOSPITALIZATION:  The patient had, on June 26, 2010, a chest x-ray that demonstrated no acute abnormalities. She had a CT of the head without contrast on June 26, 2010, that demonstrated no acute intracranial abnormality with almost complete resolution of the right subdural hematoma that she had from a previous fall.  The patient also had complete 4-view lumbar spine x-ray secondary to be complaining of pain, demonstrated no acute abnormalities and just degenerative disk and joint disease in the lower spine.  Carotid Duplex ultrasound demonstrated bilateral carotid dislocation and proximal ICA plaque, resulting in less than 50%  diameter stenosis.  The exam does not include plaque, ulceration, or embolization.  Continued surveillance is recommended.  The patient also had a 2D echo done on June 28, 2010, that demonstrated ejection fraction 60-65%.  No regional wall motion abnormalities.  Normal size cavity for the patient's left ventricle. Compared to prior study performed on June 12, 2010, there was no significant interval changes.  No other procedures were performed during this hospitalization.  Cardiology was consulted.  BRIEF HISTORY OF PRESENT ILLNESS:  An 77 year old African American lady who was at the bank for approximately 5 hours and suddenly felt lightheaded, dizziness, and lost consciousness.  The patient was standing up when the syncopal episode occurred.  The patient does not remember how long she had lost consciousness but when she did recover consciousness, she was alert, oriented, and she knew that she was in the bank.  There was no signs of losing her bowels or urine incontinence. There was no seizure activity reported.  For further details, please refer to dictation performed by Dr. Karilyn Cota on June 27, 2010.  At that moment, the patient's vital signs; temperature 98.1, blood pressure 132/77, heart rate 80, respiratory rate 12-14, oxygen saturation 97% on room air.  The patient had positive orthostatic vital sign changes with drops in her systolic blood pressure when she was lying from 150 to 120 when she was standing up.  PERTINENT LABORATORY DATA:  During this hospitalization include mild elevated ESR at 23 with normal range goes up to 22.  The patient also had a negative urinalysis with a negative culture, negative cardiac markers x3.  A TSH was 1.193.  Results at discharge, her CBC demonstrated a hemoglobin of 11.5 and platelets 149 with white blood cells 4.0.  Comprehensive metabolic panel demonstrated a sodium of 139, potassium 4.3, chloride 104, bicarb 30, blood sugar 85, BUN  25, creatinine 1.8, calcium 10.0.  HOSPITAL COURSE BY PROBLEM: 1. Syncopal episode secondary to orthostatic changes.  No     abnormalities on telemetry.  No signs of ischemia with a negative     cardiac markers and also 2D echo.  Cardiology was consulted.  There     was no abnormalities on her traces and per recommendations after     reviewing the changes of her orthostatic vital signs was to stop     Lasix and to decrease the dose of her Norvasc.  The patient remains     stable without any further syncopal episodes and is going to be     followed by primary care physician for further evaluation of her     symptoms in 7-10 days. 2. Hypertension despite changes, blood pressure had been pretty well     controlled.  Plan is to have her blood pressure follow  as an     outpatient by primary care physician and have medication adjusted     if needed. 3. Diabetes.  Plan is to continue using her glipizide and also a low-     carbohydrate diet. 4. Chronic kidney disease, stage III, creatinine at baseline, just     needs to continued to be following and monitor as an outpatient. 5. Episode of pseudohyponatremia due to elevated white blood cells.     Once the blood sugar was controlled during this hospitalization,     the patient's sodium returned to normal. 6. Atrioventricular block status post pacemaker.  Interrogation     demonstrated that the pacemaker is working properly. 7. The patient's hyperlipidemia.  Plan is to continue using statins. 8. Leg pain secondary to sciatica and neuropathy.  Plan is to follow     the patient's symptoms as an outpatient and to start the patient on     Tylenol and tramadol for severe pain. 9. History of recurrent UTI.  We are going to continue her suppressive     antibiotic.  She will follow with primary care physician for     further recommendations. 10.COPD.  We are going to continue using p.r.n. albuterol and also     p.r.n. Mucinex. 11.New finding of  carotid stenosis which is less than 50%.  Per     recommendations is to use daily baby aspirin and to have followup     surveillance as recommended by radiologist.  The patient's vital signs at discharge demonstrated a temperature of 97.7, heart rate 74, respiratory rate 16, blood pressure 122/65, oxygen saturation 98%.     Rosanna Randy, MD     CEM/MEDQ  D:  06/29/2010  T:  06/30/2010  Job:  [id number]  cc:   [name]. [name], MD [address] [place], [place] [address]  Electronically Signed by Vassie Loll MD on 07/01/2010 04:00:39 PM

## 2010-07-05 ENCOUNTER — Encounter: Payer: Self-pay | Admitting: Cardiovascular Disease

## 2010-07-06 ENCOUNTER — Telehealth: Payer: Self-pay | Admitting: Cardiovascular Disease

## 2010-07-06 ENCOUNTER — Ambulatory Visit: Payer: PRIVATE HEALTH INSURANCE | Admitting: Cardiovascular Disease

## 2010-07-07 ENCOUNTER — Other Ambulatory Visit: Payer: Self-pay | Admitting: *Deleted

## 2010-07-07 ENCOUNTER — Telehealth: Payer: Self-pay | Admitting: *Deleted
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
## 2010-07-07 NOTE — Telephone Encounter (Signed)
[facility] nurse,jennifer Dareen Piano called stating pt's sitting bp was 122/78, and standing dropped to 70/50.  Pt just discharged from hospital  with amlodipine  5 mg and hydralazine 25 bid.  Per dr swords stop hydralazine and see dr [name] next week.  Home health nurse [phone], notified and ov made with dr [name] for Monday at 9:45.

## 2010-07-10 ENCOUNTER — Ambulatory Visit (INDEPENDENT_AMBULATORY_CARE_PROVIDER_SITE_OTHER): Payer: Medicare HMO | Admitting: *Deleted

## 2010-07-10 ENCOUNTER — Ambulatory Visit (INDEPENDENT_AMBULATORY_CARE_PROVIDER_SITE_OTHER): Payer: Medicare HMO | Admitting: Internal Medicine

## 2010-07-10 ENCOUNTER — Encounter: Payer: Self-pay | Admitting: Internal Medicine

## 2010-07-10 VITALS — BP 140/100 | HR 72 | Wt 163.0 lb

## 2010-07-10 DIAGNOSIS — Z87898 Personal history of other specified conditions: Secondary | ICD-10-CM

## 2010-07-10 DIAGNOSIS — E119 Type 2 diabetes mellitus without complications: Secondary | ICD-10-CM

## 2010-07-10 DIAGNOSIS — I498 Other specified cardiac arrhythmias: Secondary | ICD-10-CM

## 2010-07-10 DIAGNOSIS — N390 Urinary tract infection, site not specified: Secondary | ICD-10-CM

## 2010-07-10 DIAGNOSIS — N259 Disorder resulting from impaired renal tubular function, unspecified: Secondary | ICD-10-CM

## 2010-07-10 DIAGNOSIS — M1990 Unspecified osteoarthritis, unspecified site: Secondary | ICD-10-CM

## 2010-07-10 DIAGNOSIS — M79609 Pain in unspecified limb: Secondary | ICD-10-CM

## 2010-07-10 DIAGNOSIS — Z950 Presence of cardiac pacemaker: Secondary | ICD-10-CM

## 2010-07-10 DIAGNOSIS — M549 Dorsalgia, unspecified: Secondary | ICD-10-CM

## 2010-07-10 DIAGNOSIS — I10 Essential (primary) hypertension: Secondary | ICD-10-CM

## 2010-07-10 NOTE — Patient Instructions (Addendum)
Your blood pressure is a bit elevated today. This is most likely because most of your blood pressure medications were discontinued..  I agree it is important to have your  cardiologist who has been managing these medications   adjust the dosing. The hydralazine may have contributed to lightheadedness when  You stand     We should continue to have the home health care agency to monitor your blood pressure in the meantime.  I will review the records that  are available.  Please minimize the use of tramadol for your pain to avoid any kind of mental fogginess.  Recommend followup visit in one month or as needed.

## 2010-07-11 ENCOUNTER — Ambulatory Visit: Payer: Medicare HMO | Admitting: Internal Medicine

## 2010-07-16 ENCOUNTER — Encounter: Payer: Self-pay | Admitting: Internal Medicine

## 2010-07-16 NOTE — Progress Notes (Signed)
Subjective:    Patient ID: [name], female    DOB: 1927-05-12, 77 y.o.   MRN: [id number]  HPI Patient comes in today's a kid working there that although she had an appointment tomorrow for post hospital follow-up. She apparently was hospitalized again for syncope on 42 2012 while she was trying to buy something standing. Her daughter states that after she felt lightheaded she just fell backwards. And then she doesn't remember much more. She denied any chest pain or shortness of breath. She was hospitalized a value weighted by the [facility] cardiology and felt to have ortho static changes possibly related to her blood pressure medication. Her labs were unrevealing for any other diagnosis. Her pacemaker was checked. She had no urinary tract infection causing this problem. She was sent home advanced home care evaluation and they called last week about her blood pressure going down below 100 when she stood up . Our office as notified  When I was out of office    And Melissa. Cato [name]  stopped her hydralazine. However it was supposed to have been stopped after discharge from the hospital on review of her records.  She was posed to have a follow-up appointment with Melissa. Elease [name] her cardiologist last week however she couldn't get a ride so she had to cancel it.   She states today she doesn't really feel dizzy or lightheaded. Her back spasm and leg pain from her last visit hospitalization and syncope are a bit good bit better but she still has to walk with a cane. Denies chest pain shortness of breath new urinary tract infection symptoms bleeding.  She is taking tramadol [name] occasionally every eight hours as needed for pain. Review of Systems No major change in her vision or hearing denies any numbness or weakness that is focal at this point.  Past history family history social history reviewed in the electronic medical record.     Objective:   Physical Exam Well-developed well-nourished looks well and  pretty well groomed. She's able to get up and walk with minimal [employer] job at [employer] related to her right back and leg. Neuro: oriented x 2 and conversation nl  Oriented x 3 and no  Obvious noted deficits in memory, attention, and speech.  Chest:  Clear to A&P without wheezes rales or rhonchi CV:  S1-S2 no gallops or murmurs peripheral perfusion is normal No sig edema in legs  Skin No bruising or bleeding   Repeat BP readings  Right arm    Reg cuff 160/80  And standing 140 range.        Assessment & Plan:  History of syncope  Recurrent  She was hospitalized in March for syncope and a subdural hematoma that formed   But fortunately appears to be better by CT and exam. Had residual back pain and right leg radicular symptoms after that. Had a repeat syncopal  episode at the beginning of this month that was felt to be from orthostatic hypotension.POss aggravated by meds.   No evidence of a seizure and no incontinence like the first episode . Carotid disease mod and non critical ( 50%)   Very  Confusing post hospital planning with multiple doctors involved and different hospitalists involved.   It appears cardiology consult in hospital  recommended she stop the hydralazine temporarily ,but apparently she was taking in as an outpatient under the care of [facility] when they called last week. . I cannot find her discharge medicines easily in this electronic  record at this time. I've told her to stay off the hydralazine however... now she has hypertension not well controlled( as it had been controlled very well for years. ) She is taking very minimal amount of tramadol and I do not think this is related.   Cardiology  has been and I  assume  is still caring for her hypertension and all of her CV medicines. However hospitalist directed her to PCP for meds  Management  post hosp.  Cards  should be adjusting the medications they have prescribed  and following up this hypotension/hypertension  problem  to decide  follow-up  Treatment plan.  Will try to facilitate getting her info to cards .   Would also  Think that neurology may give input if hasnt already.   Can refer.  ( she has seen [name] May 2011 for  evaluation of some numbness) Otherwise diabetes  And other issues are stable.      DM in control and no evidence or hypoglycemia. Renal insufficiency   Stable by creatinine Low back and right leg pain felt to be degenerative and worse after her fall in March.  Better  Today  Taking tylenol and tramadol prn.   Had bad  Ray in hospital that showed degenerative changes no fracture. Recurrent urinary tract infection on suppression doing well

## 2010-07-17 ENCOUNTER — Telehealth: Payer: Self-pay | Admitting: *Deleted

## 2010-07-17 NOTE — Telephone Encounter (Signed)
Message copied by Tor Netters on Mon Jul 17, 2010  8:01 AM ------      Message from: [facility] [place] [facility]      Created: Sun Jul 16, 2010  2:40 PM       Please tell  Patient I sent a referral  Get her appt with Dr Elease Hashimoto ( if she doesn't already have one yet)  He should be managing her BP medications.       Also neurology    To check her out because of her passing out  And back and leg pain.      Thanks

## 2010-07-17 NOTE — Telephone Encounter (Signed)
Pt aware and got her appt with Dr. Robet Leu on May 2.

## 2010-07-19 DIAGNOSIS — E119 Type 2 diabetes mellitus without complications: Secondary | ICD-10-CM

## 2010-07-19 DIAGNOSIS — R55 Syncope and collapse: Secondary | ICD-10-CM

## 2010-07-19 DIAGNOSIS — R413 Other amnesia: Secondary | ICD-10-CM

## 2010-07-19 DIAGNOSIS — I951 Orthostatic hypotension: Secondary | ICD-10-CM

## 2010-07-26 ENCOUNTER — Encounter: Payer: Self-pay | Admitting: Cardiovascular Disease

## 2010-07-26 ENCOUNTER — Ambulatory Visit (INDEPENDENT_AMBULATORY_CARE_PROVIDER_SITE_OTHER): Payer: Medicare HMO | Admitting: Cardiovascular Disease

## 2010-07-26 VITALS — BP 130/88 | HR 80 | Wt 161.0 lb

## 2010-07-26 DIAGNOSIS — Z87898 Personal history of other specified conditions: Secondary | ICD-10-CM

## 2010-07-26 NOTE — Assessment & Plan Note (Signed)
She was recently hospitalized with an episode of syncope and orthostatic hypertension. Her Lasix has been stopped and her Norvasc has been decreased. She seems to be tolerating this fairly well. Her diastolic blood pressures mildly elevated although I think it is very acceptable. I would rather be a little on the high side then for her to have episodes of syncope.
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
## 2010-07-27 ENCOUNTER — Other Ambulatory Visit: Payer: Self-pay | Admitting: Internal Medicine

## 2010-07-31 ENCOUNTER — Encounter: Payer: Medicare HMO | Admitting: *Deleted
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
## 2010-08-11 NOTE — Discharge Summary (Signed)
NAMEJORDY, [name]               ACCOUNT NO.:  [id number]   MEDICAL RECORD NO.:  [id number]          PATIENT TYPE:  INP   LOCATION:  A214                          FACILITY:  [facility]   PHYSICIAN:  Melissa Shipper, MD     DATE OF BIRTH:  1927/11/26   DATE OF ADMISSION:  01/04/2005  DATE OF DISCHARGE:  10/17/2006LH                                 DISCHARGE SUMMARY   DISCHARGE DIAGNOSES:  1.  Urosepsis with hypertension, currently improved.  2.  Recurrent urinary tract infections.  3.  Type 2 diabetes.  4.  Chronic renal insufficiency.  5.  Anemia, likely iron deficient.   Please see HPI dictated at time of admission for details regarding patient  presenting illness.   BRIEF HOSPITAL COURSE:  #1 - UROSEPSIS:  Patient is a 77 year old African-  American female who has a PMD at [facility] who we have seen a few times in  the past for various medical reasons.  Patient presented to the ED with  complaints of fever.  Was found to have UTI.  However, she developed  hypertension in the ED.  Patient was subsequently admitted to the intensive  care unit.  Was aggressively given fluid with which her blood pressures have  improved.  Patient did not require any pressors during her stay here.  Patient was initially started on levofloxacin for her UTI; however, she  continued to spike temperatures with that.  A urine culture was sent at the  time of admission which grew more than 100,000 colonies of E. coli which was  actually resistant to levofloxacin.  Subsequently, patient was changed over  to ceftriaxone to which the organism is sensitive.  Patient has improved  since then.  She has been afebrile for more than 36 hours now.  Her symptoms  have also improved.  Patient will be subsequently discharged home on p.o.  Vantin which is also a third generation cephalosporin.  Because patient has  been having recurrent UTIs we obtained renal ultrasound which suggested  multiple simple cysts in the left  kidney; however, there was complex cyst  noted in the upper pole of the right kidney which has been seen in the  previous CAT scan.  Since patient has not been evaluated for this including  a consultation with the urologist, I discussed this case briefly with Dr.  Dennie [name] who has agreed to see the patient in his office in the next  week or two.   #2 - ANEMIA:  Patient developed hemoglobin of about 8.6 during this  admission.  Review of her previous laboratories suggest her baseline is  about 9-10.  We obtained iron studies which actually showed low iron.  However, patient did have a normal ferritin.  Her percent saturation is also  low.  Her TIBC was not elevated.  This is kind of confusing because ferritin  could have been elevated because of her inflammation.  Considering  everything I am starting the patient on ferrous sulfate at this time.  Patient's hemoglobin has been stable between 8-9.  She has not had any overt  bleeding.  She has been told to watch for signs of bleeding which includes  black stools or blood in her stools or blood in her urine.   #3 - CHRONIC RENAL INSUFFICIENCY:  Patient has chronic renal failure, a  creatinine between 2-3.  Her creatinine has been stable during this  admission.   #4 - DIABETES:  Patient has mild type 2 diabetes which has also been stable.  Patient will resume her home medications.   The day of admission vital signs were all stable.  Patient has been  ambulating with no difficulties in the hospital.  She does live alone;  however, she is completely alert and oriented, knows a lot about her medical  problems.  She does have good family support in that her granddaughter stays  with her during the evening and the nighttime and she does have a daughter  who lives very close by.  Considering all of the above she is considered  stable for discharge.   DISCHARGE MEDICATIONS:  1.  Vantin 400 mg p.o. once daily for 10 more days.  Please note  this is a      renal adjusted dose.  2.  Ferrous sulfate 325 mg b.i.d.  3.  Combivent MDI two puffs inhaled every six hours.   Patient has been asked to resume her home medications including aspirin,  Diovan/HCT, Lipitor, Nexium, Glipizide, and Mucinex.  She has been told not  to take any other antibiotics except as prescribed above.   DIET:  1800 calorie ADA diet.   FOLLOW-UP CARE:  1.  Per Dr. Dennie [name] in one to two weeks.  Appointment will be made.      This is for follow-up on complex renal cyst as cause for chronic UTI.  2.  With her PMD, Dr. Fabian [name] in two or three weeks.  Patient to make      appointment.   Imaging studies done during this hospital stay include ultrasound of kidneys  as mentioned above and chest x-rays which just showed atelectasis and no  other acute process was noted.   No other consultations were done.      Melissa Shipper, MD  Electronically Signed     GK/MEDQ  D:  01/09/2005  T:  01/09/2005  Job:  [id number]   cc:   [name]. [name], M.D. [facility]  [address] [place]  [place] [address]   [name], M.D.  Fax: [phone]
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
## 2010-08-11 NOTE — Cardiovascular Report (Signed)
NAME:  [name], [name]                         ACCOUNT NO.:  [id number]   MEDICAL RECORD NO.:  [id number]                   PATIENT TYPE:  INP   LOCATION:  2019                                 FACILITY:  [facility]   PHYSICIAN:  Colleen Can. Deborah Chalk, M.D.            DATE OF BIRTH:  [birth year]   DATE OF PROCEDURE:  07/08/2003  DATE OF DISCHARGE:                              CARDIAC CATHETERIZATION   PROCEDURE:  Implantation of a dual chamber, pulse generator, with atrial and  ventricular leads system, under fluoroscopy.   INDICATION FOR PROCEDURE:  Profound AV nodal block.   PROCEDURE:  The right subclavicular area was prepped and draped.  The area  was infiltrated with 1% Xylocaine.  Two punctures were made into the  subclavian vein over top of the first rib.  Using 7-[facility] introducers,  atrial and ventricular leads were introduced.  The ventricular lead was a  Guidant 4470 active fixation lead, serial number [id number].  Ventricular  thresholds were 0.6 volts to capture at 1.1 MA current with a 0.5  millisecond pulse width.  Impedance was 590 ohms and R waves were 13.1  millivolts.  The atrial lead was a Guidant 4469 active fixation lead, serial  number [id number].  The atrial thresholds were 0.6 volts to capture and 1.8  MA current with 0.5 millisecond pulse width.  Impedance was 435 ohms and P  waves were 4.9 millivolts.   The leads were sutured in place.  The wound was flushed with kanamycin  solution.  The leads were connected to a Medtronic DDDR Kappa KVR 901 pulse  generator, serial number [id number].  The unit was sutured in place.  The  wound was closed with 2-0 and subsequently 5-0 Vicryl and Steri-Strips were  applied.  The patient tolerated the procedure well.                                               Colleen Can. Deborah Chalk, M.D.    SNT/MEDQ  D:  07/09/2003  T:  07/11/2003  Job:  [id number]   cc:   Larina Earthly, M.D.  [address]  [place]  [place] [address]   Vesta Mixer, M.D.  [address], [address]  [place]  [place] [address]  Fax: [phone]
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
## 2010-08-11 NOTE — Op Note (Signed)
Melissa, [name]                           ACCOUNT NO.:  [id number]   MEDICAL RECORD NO.:  [id number]                   PATIENT TYPE:  AMB   LOCATION:  NESC                                 [facility]ACILITY:  [facility]   PHYSICIAN:  Laqueta Linden, M.D.                 DATE OF BIRTH:  [birth year]   DATE OF PROCEDURE:  03/12/2003  DATE OF DISCHARGE:                                 OPERATIVE REPORT   PREOPERATIVE DIAGNOSIS:  Postmenopausal bleeding due to cystic endometrial  polyp.   POSTOPERATIVE DIAGNOSIS:  Postmenopausal bleeding due to cystic endometrial  polyp.   OPERATION/PROCEDURE:  Diagnostic hysteroscopy with curettage.   SURGEON:  Laqueta Linden, M.D.   ANESTHESIA:  General LMA.   ESTIMATED BLOOD LOSS:  Less than 20 mL.   FLUIDS:  Sorbitol intake - 0.   COMPLICATIONS:  None.   INDICATIONS:  [name] is a 76 year old menopausal female who presented  for evaluation of postmenopausal bleeding.  She initially underwent  endometrial sampling in the office which revealed benign tissue with mixed  endometrial and endocervical polyp.  She subsequently underwent pelvic  ultrasound with sonohystogram revealing a 19 mm endometrial stripe with  several 1-1.5 cm intramural fibroids and normal ovaries.  Sonohystogram  revealed a 16 x 14 mm cystic polyp in the uterine fundus.  Of note, both the  biopsy and the sonohystogram were quite difficult to do due to the patient's  significant upper vaginal and cervical stenosis.  The patient elected to  proceed to hysteroscopic removal of the cystic polyp.  She has seen the  informed consent, voiced her understanding and acceptance of risks, benefits  and alternatives, complications and limitations of the procedure, and agrees  to proceed.  She presents now for same-day surgery.   DESCRIPTION OF PROCEDURE:  The patient was taken to the operating room after  receiving 15 mg of Toradol IV for postoperative pain control.  She was  placed on the  operating table in the supine position and general LMA  anesthesia was introduced.  She was placed in the [place] stirrups and the  perineum and vagina were prepped and draped in the routine sterile fashion.  A transurethral Foley catheter were placed which was removed at the  conclusion of the procedure.  Bimanual examination confirmed the uterus to  be anterior.  Multiple speculums were tried to find one that would give  visualization of the upper vagina and cervix.  Due to the patient's perineal  laxity, the speculum had to be held in place by the circulating nurse so  that the cervix could be visualized.  The anterior lip of the cervix was  relatively flush with the upper vagina but was eventually grasped in a  single-tooth tenaculum so that it could be stabilized for dilation.  Tiny  Hegar dilators were used initially to try and locate the internal os.  There  was  some concern that a false passage was being created, again due to the  patient's significant stenosis and difficulty in visualizing the upper  vagina and cervix.  Dilation was accomplished up to a #23 Pratt dilator and  then the diagnostic scope was inserted.  In fact, the endometrial cavity had  been entered without evidence of perforation or false passage.  The cavity  was quite odd in its appearance and all walls were completely covered with  cystic-appearing areas with divits and crevices, making it difficult to  identify the true endometrial interface.  The left tubal ostia was  visualized.  A cystic polyp was visualized, protruding from the fundus as  per the sonohystogram.  The remainder of the cavity again appeared diffusely  cystic.  Due to the difficulty with dilation and concern over perforation if  resection was attempted, particularly in view of the difficulty to actually  view the endometrium.  It was felt that the most prudent measure was  curettage rather than electrical resection.  The curet was introduced and   sharp curettage was performed.  Multiple small pieces of cystic-appearing  tissue were obtained and sent to pathology.  The diagnostic scope was then  reintroduced and while the cavity continued to have a diffusely cystic  appearance, the dominant polyp appeared to have been removed.  There was a  slight amount of predominant cystic change on the right sidewall of the  cavity, but otherwise there were no dominant or focal-appearing lesions.  The scope was withdrawn and additional curettage focusing on the right side  of the cavity was performed.  All curettings were sent to pathology.   At this point it was felt prudent to terminate the procedure.  The tenaculum  was removed.  There was no active bleeding from the cervix or tenaculum  site.  Net sorbitol intake was 0.  Estimated blood loss was less than 20 mL.  The patient was stable on transfer to the PACU.  She will be observed and  discharged per anesthesia protocol.  She will be seen in the office in two  to three weeks.  She was given routine written and verbal discharge  instructions and told to call the office for excessive pain, fever or  bleeding, cramping or any other concerns.   Of note, this procedure was extremely difficult and required approximately  30% greater time and effort due to the above-mentioned compounding factors.                                               Laqueta Linden, M.D.    LKS/MEDQ  D:  03/12/2003  T:  03/12/2003  Job:  [id number]   cc:   [name]. [name], M.D. [facility]
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
## 2010-08-11 NOTE — Op Note (Signed)
NAME:  [name], [name]                         ACCOUNT NO.:  [id number]   MEDICAL RECORD NO.:  [id number]                   PATIENT TYPE:  INP   LOCATION:  5731                                 FACILITY:  [facility]   PHYSICIAN:  Larina Earthly, M.D.                 DATE OF BIRTH:  [birth year]   DATE OF PROCEDURE:  06/09/2003  DATE OF DISCHARGE:  06/09/2003                                 OPERATIVE REPORT   PREOPERATIVE DIAGNOSIS:  Infrarenal abdominal aortic aneurysm.   POSTOPERATIVE DIAGNOSIS:  Infrarenal abdominal aortic aneurysm.   OPERATION PERFORMED:  Aortogram with pelvic runoff.   SURGEON:  Larina Earthly, M.D.   ANESTHESIA:  1% lidocaine local.   COMPLICATIONS:  None.   DISPOSITION:  To holding area stable.   INDICATIONS FOR PROCEDURE:  The patient is a 78 year old black female with a  known abdominal aortic aneurysm.  This has had progressive enlargement to  5.5 cm and she is evaluated now for elective repair.  She does have baseline  creatinine of 2.2 and therefore was admitted for overnight hydration.   DESCRIPTION OF PROCEDURE:  The patient was taken to the peripheral vascular  cath lab and placed in supine position where the area of the right groin was  prepped and draped in the usual sterile fashion.  Using local anesthesia and  a single wall puncture, the right common femoral artery was entered and a  guidewire was passed up the level of the suprarenal aorta.  A 5 French  sheath was passed over the guidewire and a pigtail catheter was positioned  at the level of the suprarenal aorta.  AP and lateral projections were  undertaken at this level.  This revealed a 2 to 3 cm infrarenal neck of her  aorta prior to initiation of the aneurysm.  The patient had single widely  patent renal arteries bilaterally.  Lateral projection confirmed this as  well.  There was minimal angulation of the aorta.  The pigtail catheter was  then withdrawn down into the aneurysm sac and the pelvic  arteriogram was  obtained.  This showed ectasia of the right common iliac artery with no  significant aneurysm formation.  The iliac arteries themselves were widely  patent.  Oblique projections were undertaken.  The patient was given a total  of 80 mL of intravenous contrast with no immediate complications.   FINDINGS:  1. Infrarenal abdominal aortic aneurysm extending down to the level of the     aortic bifurcation.  2. 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                                               Larina Earthly,  M.D.    TFE/MEDQ  D:  06/09/2003  T:  06/10/2003  Job:  [id number]
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
## 2010-08-11 NOTE — Procedures (Signed)
NAME:  [name], [name]                           ACCOUNT NO.:  [id number]   MEDICAL RECORD NO.:  [id number]                   PATIENT TYPE:  INP   LOCATION:  A204                                 FACILITY:  [facility]   PHYSICIAN:  [place] Bing, M.D.               DATE OF BIRTH:  21-Nov-1927   DATE OF PROCEDURE:  02/12/2003  DATE OF DISCHARGE:                                  ECHOCARDIOGRAM   CLINICAL DATA:  A 77 year old woman with syncope, hypertension, diabetes,  and gastroesophageal reflux disease.   1. Technically adequate echocardiographic study.  2. Normal left atrium, right atrium and right ventricle.  3. Mild aortic sclerosis and aortic annular calcification.  4. Delicate mitral valve; mild to moderate mitral annular calcification.  5. Normal tricuspid and pulmonic valve; trivial tricuspid regurgitation;     estimated RV systolic pressure not substantially elevated.  6. Normal internal dimensions of the left ventricle; mild to moderate     hypertrophy with disproportionate involvement of the proximal septum.     Normal regional and global LV systolic function.  7. Inferior vena cava not well seen - appears normal.      ___________________________________________                                            [place] Bing, M.D.   RR/MEDQ  D:  02/12/2003  T:  02/12/2003  Job:  [id number]

## 2010-08-11 NOTE — Op Note (Signed)
NAME:  [name], [name]                         ACCOUNT NO.:  [id number]   MEDICAL RECORD NO.:  [id number]                   PATIENT TYPE:  INP   LOCATION:  2306                                 FACILITY:  [facility]   PHYSICIAN:  Larina Earthly, M.D.                 DATE OF BIRTH:  [birth year]   DATE OF PROCEDURE:  07/05/2003  DATE OF DISCHARGE:                                 OPERATIVE REPORT   PREOPERATIVE DIAGNOSIS:  Abdominal aortic aneurysm.   POSTOPERATIVE DIAGNOSIS:  Abdominal aortic aneurysm.   PROCEDURE:  Resection and grafting of abdominal aortic aneurysm and  replacement with a 14 x 8 Hemashield aorto-bilateral common iliac artery  bypass.   SURGEON:  Larina Earthly, M.D.   ASSISTANTS:  Di Kindle. Edilia Bo, M.D., and Pecola Leisure, [place]   ANESTHESIA:  General endotracheal.   COMPLICATIONS:  None.   DISPOSITION:  To recovery room extubated and in stable condition.   PROCEDURE IN DETAIL:  The patient was taken to the operating room and placed  in the supine position, where the area of the abdomen and both groins was  prepped and draped in the usual sterile fashion.  An incision was made from  the level of the xiphoid to below the umbilicus, carried down through the  midline.  The patient had a prior laparotomy through the midline as well,  and adhesions to the midline were taken down.  The patient had multiple  small ventral incisional hernias, and these were taken down as well.  The  abdomen was explored.  The liver, large and small bowel, and stomach were  normal.  The Omni-Track retractor was used for exposure.  The transverse  colon and omentum were reflected superiorly and the small bowel was  reflected to the right.  The duodenum was mobilized off the aorta and the  aorta was encircled below the level of the renal arteries.  The artery was  of good caliber at this point with minimal atherosclerotic change.  The  dissection was then taken down to the aortic  bifurcation and the common  iliac arteries were encircled with vessel loops bilaterally.  The patient  was given 25 g of mannitol and 7000 units of intravenous heparin.  After  adequate circulation time, the aorta was occluded below the level of the  renal arteries with a Harkin clamp and the common iliac arteries were  occluded distally with Henley clamps bilaterally.  The internal mammary  artery was ligated and divided.  The aorta was transected below the level of  the proximal clamp.  The lumbar back-bleeders were controlled with 2-0 silk  sutures.  The iliac arteries were transected above the distal clamps.  A 14  x 8 Hemashield graft was brought onto the field, was cut to the appropriate  length using felt strips for reinforcement, and was sewn end-to-end to the  aorta with a running 3-0  Prolene suture.  The anastomosis was tested and  found to be adequate.  Next the right and left limbs of the graft cut to the  appropriate length and were sewn end-to-end to the common iliac artery just  above the iliac bifurcation bilaterally with running 5-0 Prolene sutures.  Prior to completion of each anastomosis the usual flushing maneuvers were  undertaken.  The anastomoses then were completed and the flow was restored  to both limbs.  The patient was given 50 mg of protamine to reverse the  heparin.  The wounds were irrigated with saline, hemostasis with  electrocautery.  The wounds were closed by reapproximating the aortic wall  over the graft with a running 2-0 Vicryl s suture.  Next the retroperitoneum  was closed with running 2-0 Vicryl suture to exclude the graft from the  abdominal contents.  The small bowel was run in its entirety and found to be  without injury and was placed back in the pelvis.  The transverse colon and  omentum were placed over this.  Midline fascia was closed with #1 PDS suture  beginning proximally and distally and tying in the middle.  Skin was closed  with skin  clips.  A sterile dressing was applied.  The patient was taken to  the recovery room, extubated, in stable condition.                                               Larina Earthly, M.D.    TFE/MEDQ  D:  07/05/2003  T:  07/05/2003  Job:  [id number]

## 2010-08-11 NOTE — Op Note (Signed)
NAME:  [name], BOEREMA                         ACCOUNT NO.:  [id number]   MEDICAL RECORD NO.:  [id number]                   PATIENT TYPE:  INP   LOCATION:  2306                                 FACILITY:  [facility]   PHYSICIAN:  Larina Earthly, M.D.                 DATE OF BIRTH:  03-21-1928   DATE OF PROCEDURE:  07/05/2003  DATE OF DISCHARGE:                                 OPERATIVE REPORT   PREOPERATIVE DIAGNOSIS:  Postoperative bleeding status post aortoiliac  bypass for aneurysm.   POSTOPERATIVE DIAGNOSIS:  Postoperative bleeding status post aortoiliac  bypass for aneurysm.   PROCEDURE:  Exploratory laparotomy and repair of bleeding site from left  common iliac artery anastomosis.   SURGEON:  Larina Earthly, M.D.   ASSISTANT:  Rowe Clack, P.A.-C.   ANESTHESIA:  General endotracheal.   COMPLICATIONS:  None.   DISPOSITION:  To surgical intensive care unit in stable condition.   INDICATIONS:  The patient is a 77 year old female who on the day of this  procedure underwent uneventful aortoiliac bypass for aneurysmal disease.  The patient was initially stable in the recovery room.  Initially  postoperative hemoglobin was 9.5.  The patient did have some drop in her  blood pressure and had response to volume resuscitation.  This continued  with requiring more than the usual expected volume and repeat hemoglobin had  dropped to 6.5.  It was felt that the patient had postoperative bleeding and  is returned to the operating room.  I have discussed this with [name]  who is extubated and comfortable immediately following her initial surgery  and also with her family who understands.   DESCRIPTION OF PROCEDURE:  The patient was taken to the operating room and  placed on the operating table.  The abdomen and groin were prepped and  draped in the usual sterile fashion.  The clips from the midline incision  were removed and the midline fascia was opened by removing the PVS suture  that had been used for fascial closure.  There was some blood free in the  abdomen which was not clotted.  The peritoneum was reopened and the aneurysm  wall that had been closed over the graft was opened as well.  There was  significant blood in the left retroperitoneum and on further inspection  there initially was no bleeding from either of the three anastomoses.  With  further mobilization, there was some bleeding from the left common iliac  artery anastomosis.  This was controlled with a 5-0 Prolene suture.  The  abdominal contents were again irrigated, and there was one area of slight  bleeding from a lumbar and this was controlled with a 2-0 figure-of-eight  silk stitch.  No further bleeding was found and hemostasis was obtained with  electrocautery.  The graft was covered with the aneurysm wall with a running  2-0 Vicryl suture.  Next,  the retroperitoneum was reclosed with a running 2-  0 Vicryl suture.  The small bowel was run its entirety and  found to be without injury.  It was placed back in the pelvis and the  transverse __________ were placed over this.  The midline fascia was closed  with a #1 PDS suture beginning proximally and distally and tying in the  middle.  The skin was closed with skin clips.  The patient was taken to the  surgical intensive care unit in stable condition.                                               Larina Earthly, M.D.    TFE/MEDQ  D:  07/05/2003  T:  07/06/2003  Job:  [id number]

## 2010-08-28 ENCOUNTER — Other Ambulatory Visit: Payer: Self-pay | Admitting: Internal Medicine

## 2010-09-04 ENCOUNTER — Other Ambulatory Visit: Payer: Self-pay | Admitting: Internal Medicine

## 2010-09-27 ENCOUNTER — Other Ambulatory Visit: Payer: Self-pay | Admitting: Internal Medicine

## 2010-09-28 NOTE — Telephone Encounter (Signed)
Refill sent to pharmacy and letter sent to pt.

## 2010-10-25 ENCOUNTER — Ambulatory Visit (INDEPENDENT_AMBULATORY_CARE_PROVIDER_SITE_OTHER): Payer: Medicare HMO | Admitting: Cardiovascular Disease

## 2010-10-25 ENCOUNTER — Encounter: Payer: Self-pay | Admitting: Cardiovascular Disease

## 2010-10-25 VITALS — BP 114/82 | HR 82 | Ht 65.0 in | Wt 165.0 lb

## 2010-10-25 DIAGNOSIS — I10 Essential (primary) hypertension: Secondary | ICD-10-CM

## 2010-10-25 DIAGNOSIS — Z87898 Personal history of other specified conditions: Secondary | ICD-10-CM

## 2010-10-25 NOTE — Assessment & Plan Note (Signed)
[name] blood pressure is well controlled today. She is to continue with her same medications.

## 2010-10-25 NOTE — Assessment & Plan Note (Signed)
She's not had any episodes of syncope since we changed her medications. She seems to be very stable.
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
## 2010-10-26 ENCOUNTER — Other Ambulatory Visit: Payer: Self-pay | Admitting: Internal Medicine

## 2010-10-31 ENCOUNTER — Encounter: Payer: Self-pay | Admitting: Internal Medicine

## 2010-11-08 ENCOUNTER — Other Ambulatory Visit: Payer: Self-pay | Admitting: Internal Medicine

## 2010-11-13 ENCOUNTER — Ambulatory Visit: Payer: Medicare HMO | Admitting: Internal Medicine

## 2010-12-06 ENCOUNTER — Encounter: Payer: Self-pay | Admitting: Internal Medicine

## 2010-12-06 ENCOUNTER — Ambulatory Visit (INDEPENDENT_AMBULATORY_CARE_PROVIDER_SITE_OTHER): Payer: Medicare HMO | Admitting: Internal Medicine

## 2010-12-06 VITALS — BP 140/90 | HR 66 | Ht 65.75 in | Wt 165.0 lb

## 2010-12-06 DIAGNOSIS — E119 Type 2 diabetes mellitus without complications: Secondary | ICD-10-CM

## 2010-12-06 DIAGNOSIS — M79609 Pain in unspecified limb: Secondary | ICD-10-CM

## 2010-12-06 DIAGNOSIS — N259 Disorder resulting from impaired renal tubular function, unspecified: Secondary | ICD-10-CM

## 2010-12-06 DIAGNOSIS — E785 Hyperlipidemia, unspecified: Secondary | ICD-10-CM

## 2010-12-06 DIAGNOSIS — N390 Urinary tract infection, site not specified: Secondary | ICD-10-CM

## 2010-12-06 DIAGNOSIS — M1990 Unspecified osteoarthritis, unspecified site: Secondary | ICD-10-CM

## 2010-12-06 DIAGNOSIS — I10 Essential (primary) hypertension: Secondary | ICD-10-CM

## 2010-12-06 DIAGNOSIS — M549 Dorsalgia, unspecified: Secondary | ICD-10-CM

## 2010-12-06 LAB — BASIC METABOLIC PANEL
CO2: 29 mEq/L (ref 19–32)
Calcium: 10.7 mg/dL — ABNORMAL HIGH (ref 8.4–10.5)
Creatinine, Ser: 2.5 mg/dL — ABNORMAL HIGH (ref 0.4–1.2)
Glucose, Bld: 93 mg/dL (ref 70–99)

## 2010-12-06 LAB — POCT URINALYSIS DIPSTICK
Bilirubin, UA: NEGATIVE
Ketones, UA: NEGATIVE
pH, UA: 6.5

## 2010-12-06 LAB — LIPID PANEL
HDL: 66.3 mg/dL (ref >39.00)
Total CHOL/HDL Ratio: 3

## 2010-12-06 LAB — HEPATIC FUNCTION PANEL
AST: 21 U/L (ref 0–37)
Alkaline Phosphatase: 77 U/L (ref 39–117)
Total Bilirubin: 0.5 mg/dL (ref 0.3–1.2)

## 2010-12-06 MED ORDER — TRAMADOL HCL 50 MG PO TABS: 50.0000 mg | ORAL_TABLET | Freq: Three times a day (TID) | ORAL | Status: DC | PRN

## 2010-12-06 NOTE — Progress Notes (Signed)
  Subjective:    Patient ID: [name], female    DOB: September 16, 1927, 77 y.o.   MRN: [id number]  HPI Patient comes in today for follow up of  multiple medical problems.   No major change in health status since last visit .    Hypertension  Doing ok   Diabetes Mellitus  Checks 2-3 x  Per day  No lows .    Vision  blurr at times  Missed appt  . To go back and get left eye done cataract.  No lows.   Dyslipidemia   No change in meds   Obesity  Stable weight  Has back pain ? djd  and leg pain  Right as before  problematic no fever NVD  Has taken tramadol in the pastmostly laterally   ingeneral has been feeling much better ,   Cv no change sees cards reg and no change  Chronic throat clearing no change and no infection   Review of Systems / memory  Somewhat an issue but  Can do finances  No falling now    Not been driving now. May have uti sx now today. No bleeding  Weight loss or chills Past history family history social history reviewed in the electronic medical record.  Past history family history social history reviewed in the electronic medical record.    Objective:   Physical Exam WDWN in nad Alert and healthy appearing today. Neck no masses Chest:  Clear to A&P without wheezes rales or rhonchi CV:  S1-S2 no gallops or murmurs peripheral perfusion is normal No clubbing cyanosis or edema Oriented x 3    No motor focal. Gait slightly antalgic     Assessment & Plan:  DM   Has been controlled no recent a1c check Renal insufficiency  Followed by  Nephrology  Has been stable  S/p fall in earlier in year none now. No obv dementia but full MMi not done today .[name] Recurrent uti poss infection today .[name] Sees urologist . Will do ua and cx today and let her know results  Back and leg pain felt degenerative cause   Avoid nsaids cause of renal issues

## 2010-12-06 NOTE — Patient Instructions (Addendum)
No change in medications  Ok to  Use the tramadol  for back pain   with care.    Will notify you  of labs when available. Then plan follow up visit. Or 4 months .

## 2010-12-06 NOTE — Assessment & Plan Note (Signed)
Has been stable

## 2010-12-08 ENCOUNTER — Other Ambulatory Visit: Payer: Self-pay | Admitting: Internal Medicine

## 2010-12-08 LAB — URINE CULTURE: Colony Count: >=100000

## 2010-12-11 ENCOUNTER — Other Ambulatory Visit: Payer: Self-pay | Admitting: *Deleted

## 2010-12-11 DIAGNOSIS — N259 Disorder resulting from impaired renal tubular function, unspecified: Secondary | ICD-10-CM

## 2010-12-11 MED ORDER — CEPHALEXIN 500 MG PO CAPS: 500.0000 mg | ORAL_CAPSULE | Freq: Three times a day (TID) | ORAL | Status: AC

## 2010-12-13 ENCOUNTER — Other Ambulatory Visit: Payer: Self-pay | Admitting: Internal Medicine

## 2010-12-13 LAB — URINALYSIS, ROUTINE W REFLEX MICROSCOPIC
Bilirubin Urine: NEGATIVE
Hgb urine dipstick: NEGATIVE
Ketones, ur: NEGATIVE
Specific Gravity, Urine: 1.01
pH: 5.5

## 2010-12-13 LAB — URINE MICROSCOPIC-ADD ON

## 2010-12-14 ENCOUNTER — Telehealth: Payer: Self-pay | Admitting: *Deleted

## 2010-12-14 DIAGNOSIS — N259 Disorder resulting from impaired renal tubular function, unspecified: Secondary | ICD-10-CM

## 2010-12-14 LAB — BASIC METABOLIC PANEL
CO2: 27 mEq/L (ref 19–32)
Chloride: 103 mEq/L (ref 96–112)
Potassium: 4.9 mEq/L (ref 3.5–5.3)

## 2010-12-14 NOTE — Telephone Encounter (Signed)
Message copied by Romualdo Bolk on Thu Dec 14, 2010  2:24 PM ------      Message from: [facility], [place] K      Created: Thu Dec 14, 2010 12:37 PM       Tell patient potassium is good but her kidney function is still worse than usual.      I  Advise  repeat the BMP lab  again after she finishes with the UTI treatment  .( ok to do at [facility] if helps her)       In 2-3 weeks .  If still abnormal I want her to see her nephrologist

## 2010-12-14 NOTE — Telephone Encounter (Signed)
Left message to call back  

## 2010-12-15 LAB — URINALYSIS, ROUTINE W REFLEX MICROSCOPIC
Bilirubin Urine: NEGATIVE
Glucose, UA: NEGATIVE
Specific Gravity, Urine: 1.02
pH: 6

## 2010-12-15 LAB — COMPREHENSIVE METABOLIC PANEL
ALT: 17
Calcium: 10.2
GFR calc Af Amer: 25 — ABNORMAL LOW
Glucose, Bld: 111 — ABNORMAL HIGH
Sodium: 141
Total Protein: 7.1

## 2010-12-15 LAB — URINE MICROSCOPIC-ADD ON

## 2010-12-15 LAB — CBC
Hemoglobin: 13.8
MCHC: 33.2
RDW: 14.3

## 2010-12-15 LAB — DIFFERENTIAL
Eosinophils Absolute: 0.1
Lymphs Abs: 0.9
Monocytes Relative: 5
Neutrophils Relative %: 83 — ABNORMAL HIGH

## 2010-12-20 NOTE — Telephone Encounter (Signed)
Pt aware of results 

## 2010-12-22 LAB — URINE MICROSCOPIC-ADD ON

## 2010-12-22 LAB — URINALYSIS, ROUTINE W REFLEX MICROSCOPIC
Bilirubin Urine: NEGATIVE
Glucose, UA: NEGATIVE
Hgb urine dipstick: NEGATIVE
Ketones, ur: NEGATIVE
Specific Gravity, Urine: 1.015
pH: 6
pH: 5.5

## 2010-12-22 LAB — URINE CULTURE

## 2010-12-25 LAB — URINALYSIS, ROUTINE W REFLEX MICROSCOPIC
Bilirubin Urine: NEGATIVE
Nitrite: NEGATIVE
Specific Gravity, Urine: 1.015
pH: 6

## 2010-12-25 LAB — URINE MICROSCOPIC-ADD ON

## 2010-12-26 LAB — URINALYSIS, ROUTINE W REFLEX MICROSCOPIC
Hgb urine dipstick: NEGATIVE
Ketones, ur: NEGATIVE
Protein, ur: 100 — AB
Urobilinogen, UA: 0.2

## 2010-12-26 LAB — URINE MICROSCOPIC-ADD ON

## 2010-12-26 LAB — URINE CULTURE: Colony Count: >=100000

## 2011-01-17 ENCOUNTER — Telehealth: Payer: Self-pay | Admitting: *Deleted

## 2011-01-17 NOTE — Telephone Encounter (Signed)
Pt went to [facility] today for lab work and she states they didn't have any orders for her- it looks there are future orders in computer

## 2011-01-18 ENCOUNTER — Other Ambulatory Visit: Payer: Self-pay | Admitting: Internal Medicine

## 2011-01-22 ENCOUNTER — Telehealth: Payer: Self-pay | Admitting: Family Medicine

## 2011-01-22 NOTE — Telephone Encounter (Signed)
Pt called again today and spoke with Joni Reining. Pt states she did not get labs last week at [facility]. She is saying they have no orders? Even though the order is in the system, and they are on EPIC, I got a call from their lab last week requesting we fax them the lab order and a demographics sheet on the pt. I did this on 10/254, faxing it to [phone]. I don't understand what the issue is, and the patient sounds very confused. Please advise.

## 2011-01-22 NOTE — Telephone Encounter (Signed)
They are in the computer system.

## 2011-01-22 NOTE — Telephone Encounter (Signed)
Pt called to check on status of getting labs orders so she can get them done at [facility]. Pt called about this last week. Pls call pt when labs have been ordered.

## 2011-01-25 ENCOUNTER — Other Ambulatory Visit: Payer: Self-pay | Admitting: Internal Medicine

## 2011-01-26 LAB — BASIC METABOLIC PANEL
BUN: 31 mg/dL — ABNORMAL HIGH (ref 6–23)
Calcium: 10.4 mg/dL (ref 8.4–10.5)
Chloride: 101 mEq/L (ref 96–112)
Creat: 2.25 mg/dL — ABNORMAL HIGH (ref 0.50–1.10)

## 2011-02-06 ENCOUNTER — Encounter: Payer: Self-pay | Admitting: *Deleted

## 2011-02-26 ENCOUNTER — Encounter: Payer: Medicare HMO | Admitting: *Deleted

## 2011-03-08 ENCOUNTER — Other Ambulatory Visit: Payer: Self-pay | Admitting: Internal Medicine

## 2011-03-08 NOTE — Telephone Encounter (Signed)
Pt is out of her Glipizide and Norvasc. She went to [facility] in [place] and they gave her 3 pills of each. She is requesting refills of these two meds. Her next appt with [facility] is 1.22.2013

## 2011-04-02 ENCOUNTER — Telehealth: Payer: Self-pay | Admitting: Internal Medicine

## 2011-04-02 NOTE — Telephone Encounter (Signed)
Pts daughter called and is req wanting Dr [name] to call her. Daughter has some questions re: pts care. Pt is unable to live a home by herself. Need to know what they need to do about getting in home care or what options there may be.

## 2011-04-03 NOTE — Telephone Encounter (Signed)
Spoke to pt's daughter- they are trying to get pt into assisted living. Pt is not going willing. Advised daughter to call Social Services and get info from them as far as POA and a IT trainer. Also come with her to her next appt as well.

## 2011-04-12 ENCOUNTER — Encounter: Payer: Self-pay | Admitting: Cardiovascular Disease

## 2011-04-17 ENCOUNTER — Encounter: Payer: Self-pay | Admitting: Internal Medicine

## 2011-04-17 ENCOUNTER — Ambulatory Visit (INDEPENDENT_AMBULATORY_CARE_PROVIDER_SITE_OTHER): Payer: Medicare HMO | Admitting: Internal Medicine

## 2011-04-17 VITALS — BP 110/70 | HR 66 | Wt 171.0 lb

## 2011-04-17 DIAGNOSIS — N259 Disorder resulting from impaired renal tubular function, unspecified: Secondary | ICD-10-CM

## 2011-04-17 DIAGNOSIS — N390 Urinary tract infection, site not specified: Secondary | ICD-10-CM

## 2011-04-17 DIAGNOSIS — I10 Essential (primary) hypertension: Secondary | ICD-10-CM

## 2011-04-17 DIAGNOSIS — E785 Hyperlipidemia, unspecified: Secondary | ICD-10-CM

## 2011-04-17 DIAGNOSIS — E119 Type 2 diabetes mellitus without complications: Secondary | ICD-10-CM

## 2011-04-17 DIAGNOSIS — R413 Other amnesia: Secondary | ICD-10-CM

## 2011-04-17 MED ORDER — AMLODIPINE BESYLATE 5 MG PO TABS: 5.0000 mg | ORAL_TABLET | Freq: Every day | ORAL | Status: DC

## 2011-04-17 MED ORDER — GLIPIZIDE ER 2.5 MG PO TB24: 2.5000 mg | ORAL_TABLET | Freq: Every day | ORAL | Status: DC

## 2011-04-17 NOTE — Patient Instructions (Addendum)
Check your blood sugars twice a day and   Write this down .  Then I want you to come back in 2 months with the  Readings .   Call if they are too low ( below 85) and we may have you stop the medication. Call if any problems with blood sugar.   Consider getting another opinion about your memory and concentration. ( ie neurology)   Pain pills can sometimes cause a memory issue also and should only take this in moderation and with caution.

## 2011-04-17 NOTE — Progress Notes (Signed)
Subjective:    Patient ID: [name], female    DOB: Mar 06, 1928, 78 y.o.   MRN: [id number]  HPI Patient comes in today for follow up of  multiple medical problems.  Since last visit  in the fall   Her labs have been stable  Cr 2.25 and potassium now normal a month ago.  Had uti and rx with keflex.  No ed visit s See phone notef rom daughter . They live in [place]  Son in [place]  Son helps with driving and Engineer, site. No falling except tripped on sliding glass track and knee.   Checking  Bg twice a day  And getting readings  Under 100.  BUt denies hypoglycemia sx syncope or similar .  Resp no sob had chronic throat clearing no change.  Memory somewhat problematic.  Sometimes misses appts  No disorientation.  Takes longer to remember day of week but other facts ok .   No new meds  Only rarely used tramadol  .  No focal weakness or burning or vision change.    Review of Systems Neg cp sob bleeding no falls joint swelling  HA .   Still pain right back and leg sx  No ulcers or foot infections  Past history family history social history reviewed in the electronic medical record. Outpatient Encounter Prescriptions as of 04/17/2011  Medication Sig Dispense Refill  . acetaminophen (TYLENOL) 500 MG tablet Take 500 mg by mouth every 6 (six) hours as needed.        [name] albuterol (PROVENTIL HFA) 108 (90 BASE) MCG/ACT inhaler Inhale 2 puffs into the lungs every 6 (six) hours as needed.        [name] amLODipine (NORVASC) 5 MG tablet Take 1 tablet (5 mg total) by mouth daily.  30 tablet  11  . aspirin 81 MG chewable tablet Chew 81 mg by mouth daily.        . calcium-vitamin D (OSCAL WITH D) 500-200 MG-UNIT per tablet Take 1 tablet by mouth daily.        [name] glipiZIDE (GLUCOTROL XL) 2.5 MG 24 hr tablet Take 1 tablet (2.5 mg total) by mouth daily.  30 tablet  5  . guaiFENesin (MUCINEX) 600 MG 12 hr tablet Take 1,200 mg by mouth as needed.       . Multiple Vitamin (MULTIVITAMIN) tablet Take 1 tablet by  mouth daily.        . simvastatin (ZOCOR) 20 MG tablet Take 20 mg by mouth at bedtime.        . traMADol (ULTRAM) 50 MG tablet Take 1 tablet (50 mg total) by mouth every 8 (eight) hours as needed.  45 tablet  0  . DISCONTD: amLODipine (NORVASC) 5 MG tablet TAKE 1 TABLET BY MOUTH EVERY DAY PATIENT NEEDS OFFICE VISIT  30 tablet  0  . DISCONTD: amLODipine (NORVASC) 5 MG tablet       . DISCONTD: glipiZIDE (GLUCOTROL XL) 2.5 MG 24 hr tablet TAKE 1 TABLET BY MOUTH EVERY DAY ** MUST SCHEDULE APPT FOR NEXT REFILL  30 tablet  0  . DISCONTD: glipiZIDE (GLUCOTROL XL) 2.5 MG 24 hr tablet            Objective:   Physical Exam WDWN in nad  Gait slightly antalgic  orented x 3  speeck normal  Has some difficulty with number date but  Quick with others.  Chest:  Clear to A&P without wheezes rales or rhonchi CV:  S1-S2 no gallops  or murmurs peripheral perfusion is normal Feet grossly normal inspection no ulcers unusual callus. Lab Results  Component Value Date   WBC 4.0 06/29/2010   HGB 11.5* 06/29/2010   HCT 35.6* 06/29/2010   PLT 149* 06/29/2010   GLUCOSE 88 01/25/2011   CHOL 190 12/06/2010   TRIG 108.0 12/06/2010   HDL 66.30 12/06/2010   LDLCALC 102* 12/06/2010   ALT 12 12/06/2010   AST 21 12/06/2010   NA 138 01/25/2011   K 5.2 01/25/2011   CL 101 01/25/2011   CREATININE 2.25* 01/25/2011   BUN 31* 01/25/2011   CO2 31 01/25/2011   TSH 1.193 06/27/2010   INR 1.53* 06/26/2010   HGBA1C 6.0 12/06/2010         Assessment & Plan:   DM  Seems stable caution to avoid low bg and disc with her  About this consider stopping med all together incontrol disc concern to avoid hypoglycemia with the above issue Says bg is ok but still concern about  .  Memory concerns : oriented today and normal conversation but hard time remember day of week ( tues ) otherwise has date right.   dsic meds that can effect memory. No obv deterioration consider close fu .  Renal   Insufficiency  stable  Counseled.   HT  Stable  Disc  future living situation    And support . Currently seems stable but may want to disc with family about future support.  We discussed this a good deal. Total visit > 50% spent counseling and coordinating care

## 2011-04-23 ENCOUNTER — Telehealth: Payer: Self-pay | Admitting: *Deleted

## 2011-04-23 NOTE — Telephone Encounter (Signed)
Message copied by Romualdo Bolk on Mon Apr 23, 2011  8:35 AM ------      Message from: [facility], [place] K      Created: Sun Apr 22, 2011  6:59 PM       [name] see her lab results see if she got them done when she was here . If not maybe she can get them at [facility] but dont know how to do this.

## 2011-04-23 NOTE — Telephone Encounter (Signed)
Both numbers have been disconnected. I have mailed pt a letter to call us about getting her labs done.

## 2011-04-25 ENCOUNTER — Ambulatory Visit: Payer: Medicare HMO | Admitting: Cardiovascular Disease

## 2011-05-03 ENCOUNTER — Other Ambulatory Visit (HOSPITAL_COMMUNITY)
Admission: RE | Admit: 2011-05-03 | Discharge: 2011-05-03 | Disposition: A | Discharge status: Home / Self Care | Payer: Medicare HMO | Source: Ambulatory Visit | Attending: Obstetrics & Gynecology | Admitting: Obstetrics & Gynecology

## 2011-05-03 ENCOUNTER — Other Ambulatory Visit: Payer: Self-pay | Admitting: Obstetrics & Gynecology

## 2011-05-03 DIAGNOSIS — R87810 Cervical high risk human papillomavirus (HPV) DNA test positive: Secondary | ICD-10-CM | POA: Insufficient documentation

## 2011-05-03 DIAGNOSIS — Z01419 Encounter for gynecological examination (general) (routine) without abnormal findings: Secondary | ICD-10-CM | POA: Insufficient documentation

## 2011-05-07 NOTE — Telephone Encounter (Signed)
Pt didn't know that she was suppose to go to the lab. Pt is going to [facility] to get the labs done.

## 2011-05-08 ENCOUNTER — Other Ambulatory Visit: Payer: Self-pay | Admitting: Internal Medicine

## 2011-05-08 ENCOUNTER — Telehealth: Payer: Self-pay | Admitting: *Deleted

## 2011-05-08 NOTE — Telephone Encounter (Signed)
04-12-11 sent past due letter/mt 05-08-11 lmm @ 1203p for pt to call to set up pacemaker ck/mt

## 2011-05-09 LAB — BASIC METABOLIC PANEL
Chloride: 105 mEq/L (ref 96–112)
Potassium: 4.1 mEq/L (ref 3.5–5.3)
Sodium: 139 mEq/L (ref 135–145)

## 2011-05-09 LAB — TSH: TSH: 4.849 u[IU]/mL — ABNORMAL HIGH (ref 0.350–4.500)

## 2011-05-09 LAB — VITAMIN B12: Vitamin B-12: 294 pg/mL (ref 211–911)

## 2011-05-10 ENCOUNTER — Telehealth: Payer: Self-pay | Admitting: *Deleted

## 2011-05-10 DIAGNOSIS — I10 Essential (primary) hypertension: Secondary | ICD-10-CM

## 2011-05-10 NOTE — Telephone Encounter (Signed)
Please call pt's lab results.  It is ok to leave a message if no answer.

## 2011-05-10 NOTE — Telephone Encounter (Deleted)
I do not give results to pt especially without a MD's authorization and explanation for the results.  The nurses all do their own.

## 2011-05-11 NOTE — Telephone Encounter (Signed)
Please see result note 

## 2011-05-11 NOTE — Progress Notes (Signed)
Quick Note:  Pt aware of results. ______ 

## 2011-05-11 NOTE — Telephone Encounter (Signed)
Pt aware of results 

## 2011-05-22 ENCOUNTER — Encounter: Payer: Self-pay | Admitting: Cardiovascular Disease

## 2011-05-22 ENCOUNTER — Ambulatory Visit (INDEPENDENT_AMBULATORY_CARE_PROVIDER_SITE_OTHER): Payer: Medicare HMO | Admitting: Cardiovascular Disease

## 2011-05-22 DIAGNOSIS — E785 Hyperlipidemia, unspecified: Secondary | ICD-10-CM

## 2011-05-22 DIAGNOSIS — Z950 Presence of cardiac pacemaker: Secondary | ICD-10-CM

## 2011-05-22 DIAGNOSIS — I10 Essential (primary) hypertension: Secondary | ICD-10-CM
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
## 2011-05-22 NOTE — Patient Instructions (Signed)
Your physician wants you to follow-up in: 6 months  You will receive a reminder letter in the mail two months in advance. If you don't receive a letter, please call our office to schedule the follow-up appointment.  Your physician recommends that you continue on your current medications as directed. Please refer to the Current Medication list given to you today.  

## 2011-05-22 NOTE — Assessment & Plan Note (Signed)
[name] continues to have mildly elevated blood pressure. She still eats extra salt.  She has moderate renal dysfunction with a creatinine of 2.6. She may need to see a nephrologist at some point.Peggye Form asked her to cut back her salt intake. She will continue to followup with me in 6 months.

## 2011-06-29 ENCOUNTER — Encounter: Payer: Self-pay | Admitting: *Deleted

## 2011-06-29 ENCOUNTER — Telehealth: Payer: Self-pay | Admitting: *Deleted

## 2011-06-29 NOTE — Telephone Encounter (Signed)
06-29-11 SENT PT PAST DUE LETTER/MT 

## 2011-07-03 ENCOUNTER — Telehealth: Payer: Self-pay | Admitting: Internal Medicine

## 2011-07-03 NOTE — Telephone Encounter (Signed)
[facility] called req script that was sent for pts Diabetic Testing Supplies. Need the dx code ICD 9 in order to process order.

## 2011-07-09 NOTE — Telephone Encounter (Signed)
Left a message for pt to return call 

## 2011-07-09 NOTE — Telephone Encounter (Signed)
Orders refaxed to [facility].

## 2011-07-25 ENCOUNTER — Encounter: Payer: Self-pay | Admitting: Internal Medicine

## 2011-07-25 ENCOUNTER — Ambulatory Visit (INDEPENDENT_AMBULATORY_CARE_PROVIDER_SITE_OTHER): Payer: Medicare HMO | Admitting: Internal Medicine

## 2011-07-25 DIAGNOSIS — IMO0001 Reserved for inherently not codable concepts without codable children: Secondary | ICD-10-CM

## 2011-07-25 DIAGNOSIS — E119 Type 2 diabetes mellitus without complications: Secondary | ICD-10-CM

## 2011-07-25 DIAGNOSIS — Z719 Counseling, unspecified: Secondary | ICD-10-CM | POA: Insufficient documentation

## 2011-07-25 DIAGNOSIS — R7989 Other specified abnormal findings of blood chemistry: Secondary | ICD-10-CM | POA: Insufficient documentation

## 2011-07-25 DIAGNOSIS — R413 Other amnesia: Secondary | ICD-10-CM

## 2011-07-25 DIAGNOSIS — R6889 Other general symptoms and signs: Secondary | ICD-10-CM

## 2011-07-25 NOTE — Patient Instructions (Signed)
To have appt may 8th at noon and bring family member   To drive And all bottles at home to be able to go over  her meds .

## 2011-07-25 NOTE — Progress Notes (Signed)
  Subjective:    Patient ID: [name], female    DOB: [birth year], 78 y.o.   MRN: [id number]  HPI Family concerned today about  Patients  Mental decline.  Over the last 6 months released since she fell and hit her head twice. See past history. Daughters [name] and [name] relates that they're concerned about the mother's ability to manage her finances. She often lets the bills go unpaid the water turned off as well as the phone and there is often no food in the refrigerator.   the patient gets angry with them when they bring up the situation and the patient asks them to bar money for her utilities. In the meantime the patient is sending a $2000 check of her retirement every month to a man in [place] who apparently tells her that he is has a house up there t where she can move back to.  Family states that the lawn for agencies up in [place] cannot do anything unless [name] is deemed incompetent. For the court. [name] has no power of attorney at this time.   Review of Systems Vision may be decreased with one cataract hearing is so-so no recent falls that are known she is no longer driving over the last number of years cognitive decline seems to be worse over the last 6 months  Patient has 3 children 2 daughters and a son who lives in [place].  She apparently is not having them active in her life and none of them have a key to her residence. They're currently worried about her safety.     Objective:   Physical Exam  NE See last note from cardiology. Her last CT of the head was March 2012 subdural was resolved she has a pacemaker so no MRI of the brain has been done.     Assessment & Plan:  Family concerned about cognitive decline  See last note about neurology evaluation and concern about memory from patient.  .,  Not believe a full consult was ever made. Patient did not followup with her 3 month visit as planned. We'll arrange for her to be seen May 8 2  bring in all of her bottles of medication that she has at home and have a family member come with her to the visit to decide next step in evaluation.  We'll review record to see if appropriate labs such as B12, TSH her current consider other evaluation.  It appears that her B12 was low normal and her TSH was borderline elevated at the last lab test these should be optimized repeated.  We also want to consider stopping the Savon a urea to avoid hypoglycemia as not being monitored. Appropriately.  Total visit > 50% spent counseling and  Planning and coordinating care

## 2011-08-01 ENCOUNTER — Ambulatory Visit (INDEPENDENT_AMBULATORY_CARE_PROVIDER_SITE_OTHER): Payer: Medicare HMO | Admitting: Internal Medicine

## 2011-08-01 ENCOUNTER — Encounter: Payer: Self-pay | Admitting: Internal Medicine

## 2011-08-01 VITALS — BP 124/86 | HR 83 | Temp 97.8°F | Wt 175.0 lb

## 2011-08-01 DIAGNOSIS — N259 Disorder resulting from impaired renal tubular function, unspecified: Secondary | ICD-10-CM

## 2011-08-01 DIAGNOSIS — E119 Type 2 diabetes mellitus without complications: Secondary | ICD-10-CM

## 2011-08-01 DIAGNOSIS — I10 Essential (primary) hypertension: Secondary | ICD-10-CM

## 2011-08-01 DIAGNOSIS — R946 Abnormal results of thyroid function studies: Secondary | ICD-10-CM

## 2011-08-01 DIAGNOSIS — R413 Other amnesia: Secondary | ICD-10-CM

## 2011-08-01 LAB — POCT CBG (FASTING - GLUCOSE)-MANUAL ENTRY: Glucose Fasting, POC: 151 mg/dL — AB (ref 70–99)

## 2011-08-01 LAB — BASIC METABOLIC PANEL
GFR: 17.02 mL/min — ABNORMAL LOW (ref >60.00)
Glucose, Bld: 91 mg/dL (ref 70–99)
Potassium: 4.6 mEq/L (ref 3.5–5.1)
Sodium: 142 mEq/L (ref 135–145)

## 2011-08-01 NOTE — Progress Notes (Signed)
  Subjective:    Patient ID: [name], female    DOB: October 08, 1927, 78 y.o.   MRN: [id number]  HPI Patient comes in as requested for a delayed appointment comes with daughter is driving. She does bring all bottles of her medications.  Most of visit with pt  And later with daughter .  She missed her last appointment she is taking her medicines regularly takes an occasional ibuprofen we believe. She has no recurrent falls headaches or changes in her vision [place] has had some problems with the cataract and dry. She states that her memory hasn't changed that much but she has a hard time remembering days but knows the year or seasons. She denies having a problem with food or getting her bills paid on time.  She states that she will go get her in food that she does appear bills on time when she has money pacing and person.  Likes to stay at home on her porch doesn't go out much because she doesn't have a car.    She states she doesn't get her money away to anyone else.    She is worried that her children are out to get what she does have. I.e. Money.  She does trust her granddaughter. At this time she doesn't have a car access to transportation except for her family and feels a bit distressed about the use to like her independence.  Family is concerned stating that she is giving her money away to a person in [place] he was promising her house or other .  And wanted her to move to [facility] but she doesn't really want to do that and wants to be more independent. Review of Systems No cp sob recently no  fall shaking   No edema chest pain unusual bleeding. Denies any specific numbness.  Past history family history social history reviewed in the electronic medical record.     Objective:   Physical Exam BP 124/86  Pulse 83  Temp(Src) 97.8 F (36.6 C) (Oral)  Wt 175 lb (79.379 kg)  SpO2 96% Oriented to person year and months did get the date right but says that she has problems  without States that she doesn't do calculations some difficulties with some names. Cardiovascular stable today looks well.    Assessment & Plan:  Memory loss Cognitive deficit deficit is obvious but she is able to speak normally and give her opinions. Rule out metabolic  will reset up a neurology consult to look for reversible causes of her memory difficulties they do seem to  back to the episode of a fall and then when she had the subdural.  Next last appointment unsure of his progressive but a significant. Patient able to do ADLs  doesn't really want her first degree relatives to be her healthcare power of attorney. Discussed this at length consider getting her granddaughter involved have her go see a lawyer so she can make her own choices at this time. Diabetes no evidence of hypoglycemia.Abnormal TSH needs to be repeated today wasn't done in March because patient missed appointment. Avoid ibuprofen because of her renal failure insufficiency. Recurrent UTIs on suppressive therapy. Renal insuff   following   Total visit > 50% spent counseling and coordinating care

## 2011-08-01 NOTE — Patient Instructions (Addendum)
No change in her medication at this time but I would avoid taking ibuprofen.  Will send him a neurology consult and much her daughter notes that time so she can drive you.   Strongly recommend he see a lawyer about getting a health care power of attorney as to when you trust.  You are due for labs also that were not done last time

## 2011-08-02 LAB — VITAMIN B12: Vitamin B-12: 334 pg/mL (ref 211–911)

## 2011-08-04 DIAGNOSIS — R946 Abnormal results of thyroid function studies: Secondary | ICD-10-CM | POA: Insufficient documentation

## 2011-08-06 ENCOUNTER — Telehealth: Payer: Self-pay | Admitting: Speech Pathology

## 2011-08-06 DIAGNOSIS — R413 Other amnesia: Secondary | ICD-10-CM

## 2011-08-06 NOTE — Telephone Encounter (Signed)
FYI: [name] is pt's sister.

## 2011-08-06 NOTE — Telephone Encounter (Signed)
Pt's daughter, [name] called and was asking about the Neurology referral, that was discussed at last OV.  I will need a new referral for this, if Dr. [name] agrees.  Please note to call pt's daughter re: scheduling an appt.

## 2011-08-06 NOTE — Telephone Encounter (Signed)
Terri I have not been able to sign any orders  ont his lady.  Thus didn't get to YOU I sent in a ticket butunsure if this is fixed yet.  Pl;ease review the record and see if you can bypass the system.   I havent been able to do anything with closing her record as of this am.

## 2011-08-07 NOTE — Telephone Encounter (Signed)
Referral request sent.  The past referral was to [facility] and they can see her sooner.  Or would you like for her to see Dr Modesto Charon?

## 2011-08-14 NOTE — Telephone Encounter (Signed)
Pt saw [place] neuro

## 2011-08-16 ENCOUNTER — Encounter: Payer: Self-pay | Admitting: Internal Medicine

## 2011-08-16 ENCOUNTER — Telehealth: Payer: Self-pay | Admitting: Family Medicine

## 2011-08-16 ENCOUNTER — Ambulatory Visit (INDEPENDENT_AMBULATORY_CARE_PROVIDER_SITE_OTHER): Payer: Medicare HMO | Admitting: *Deleted

## 2011-08-16 DIAGNOSIS — I498 Other specified cardiac arrhythmias: Secondary | ICD-10-CM

## 2011-08-16 DIAGNOSIS — R001 Bradycardia, unspecified: Secondary | ICD-10-CM

## 2011-08-16 LAB — PACEMAKER DEVICE OBSERVATION
AL AMPLITUDE: 2.8 mv
AL IMPEDENCE PM: 405 Ohm
AL THRESHOLD: 0.75 V
RV LEAD AMPLITUDE: 22.4 mv
RV LEAD IMPEDENCE PM: 425 Ohm

## 2011-08-16 NOTE — Telephone Encounter (Signed)
Cardiology called. Pt's daughter, [name], had her in there today for device check. They are very upset, because after they saw [facility] for [name]'s assessment and lab work, they've never received a call back with the results and what the next steps are. They've tried calling several times, with no results. Can you PLEASE call [facility] and try to get some assistance and answers for this patient? Thanks. [name] - daughter - cell: [phone])

## 2011-08-16 NOTE — Progress Notes (Signed)
Pacer check in clinic  

## 2011-08-16 NOTE — Telephone Encounter (Signed)
FYI:  Called [facility] and left a message at ext. [phone] that pt had not received her results and also requesting results be sent to Dr. [name].

## 2011-08-18 ENCOUNTER — Emergency Department (HOSPITAL_COMMUNITY): Payer: Medicare HMO

## 2011-08-18 ENCOUNTER — Encounter (HOSPITAL_COMMUNITY): Payer: Self-pay | Admitting: *Deleted

## 2011-08-18 ENCOUNTER — Emergency Department (HOSPITAL_COMMUNITY)
Admission: EM | Admit: 2011-08-18 | Discharge: 2011-08-18 | Disposition: A | Discharge status: Home / Self Care | Payer: Medicare HMO | Attending: Emergency Medicine | Admitting: Emergency Medicine

## 2011-08-18 DIAGNOSIS — R059 Cough, unspecified: Secondary | ICD-10-CM | POA: Insufficient documentation

## 2011-08-18 DIAGNOSIS — J40 Bronchitis, not specified as acute or chronic: Secondary | ICD-10-CM

## 2011-08-18 DIAGNOSIS — E669 Obesity, unspecified: Secondary | ICD-10-CM | POA: Insufficient documentation

## 2011-08-18 DIAGNOSIS — E785 Hyperlipidemia, unspecified: Secondary | ICD-10-CM | POA: Insufficient documentation

## 2011-08-18 DIAGNOSIS — E119 Type 2 diabetes mellitus without complications: Secondary | ICD-10-CM | POA: Insufficient documentation

## 2011-08-18 DIAGNOSIS — R05 Cough: Secondary | ICD-10-CM | POA: Insufficient documentation

## 2011-08-18 DIAGNOSIS — I10 Essential (primary) hypertension: Secondary | ICD-10-CM | POA: Insufficient documentation

## 2011-08-18 DIAGNOSIS — R0602 Shortness of breath: Secondary | ICD-10-CM | POA: Insufficient documentation

## 2011-08-18 MED ORDER — ALBUTEROL SULFATE (5 MG/ML) 0.5% IN NEBU
5.0000 mg | INHALATION_SOLUTION | Freq: Once | RESPIRATORY_TRACT | Status: AC
  Administered 2011-08-18: 5 mg via RESPIRATORY_TRACT
  Filled 2011-08-18: qty 1

## 2011-08-18 MED ORDER — AMOXICILLIN 500 MG PO CAPS: 500.0000 mg | ORAL_CAPSULE | Freq: Three times a day (TID) | ORAL | Status: AC

## 2011-08-18 MED ORDER — ALBUTEROL SULFATE HFA 108 (90 BASE) MCG/ACT IN AERS: 2.0000 | INHALATION_SPRAY | RESPIRATORY_TRACT | Status: AC | PRN

## 2011-08-18 MED ORDER — IPRATROPIUM BROMIDE 0.02 % IN SOLN
0.5000 mg | Freq: Once | RESPIRATORY_TRACT | Status: AC
  Administered 2011-08-18: 0.5 mg via RESPIRATORY_TRACT
  Filled 2011-08-18: qty 2.5
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
## 2011-08-18 NOTE — ED Notes (Signed)
Pt c/o feeling like she can't breath off and on. Started last night. Pt states that it feels like "her wind is shut off". No respiratory distress or labored respirations noted.

## 2011-08-18 NOTE — Discharge Instructions (Signed)
Drink plenty of fluids. Use the inhaler 2 puffs every 4-6 hours for shortness of breath or wheezing. Continue the Mucinex for your cough. Return if you get fever, your breathing gets worse, you get chest pain, or you seem worse.  Bronchitis Bronchitis is a problem of the air tubes leading to your lungs. This problem makes it hard for air to get in and out of the lungs. You may cough a lot because your air tubes are narrow. Going without care can cause lasting (chronic) bronchitis. HOME CARE   Drink enough fluids to keep your pee (urine) clear or pale yellow.   Use a cool mist humidifier.   Quit smoking if you smoke. If you keep smoking, the bronchitis might not get better.   Only take medicine as told by your doctor.  GET HELP RIGHT AWAY IF:   Coughing keeps you awake.   You start to wheeze.   You become more sick or weak.   You have a hard time breathing or get short of breath.   You cough up blood.   Coughing lasts more than 2 weeks.   You have a fever.   Your baby is older than 3 months with a rectal temperature of 102 F (38.9 C) or higher.   Your baby is 76 months old or younger with a rectal temperature of 100.4 F (38 C) or higher.  MAKE SURE YOU:  Understand these instructions.   Will watch your condition.   Will get help right away if you are not doing well or get worse.  Document Released: 08/29/2007 Document Revised: 03/01/2011 Document Reviewed: 02/11/2009 [facility] Patient Information 2012 [place], [place].

## 2011-08-21 ENCOUNTER — Other Ambulatory Visit: Payer: Self-pay | Admitting: Neurology

## 2011-08-21 DIAGNOSIS — E1142 Type 2 diabetes mellitus with diabetic polyneuropathy: Secondary | ICD-10-CM

## 2011-08-21 DIAGNOSIS — F09 Unspecified mental disorder due to known physiological condition: Secondary | ICD-10-CM

## 2011-08-21 DIAGNOSIS — R269 Unspecified abnormalities of gait and mobility: Secondary | ICD-10-CM

## 2011-08-23 NOTE — Telephone Encounter (Signed)
fyi I received note from may 15 but dont have the results  of testing  It appears they wanted to get a head ct scan  And had blood tests  IMake sure  pt and daughter informed of what we know

## 2011-08-24 NOTE — Telephone Encounter (Signed)
Pt's number is not in service at this time.  Per pt's daughter [name] pt was taken to kidney doctor and told that pt's kidneys were worse and pt had gained 10 lbs which is thought to be fluid.  Pt has been put on fluid pill.    Pt's daughter states she will call [facility] on Monday to set up CT because she was not given a time or date for this.

## 2011-08-28 ENCOUNTER — Telehealth: Payer: Self-pay | Admitting: Family Medicine

## 2011-08-28 NOTE — Telephone Encounter (Signed)
[address] [place], [place] [address] p. [phone] f. [phone] To: [facility] Fax: [phone] From: [facility] Date/ Time: 08/27/2011 5:06 PM Taken By: Forbes Cellar, CSR Caller: Lowella Bandy Facility: not collected Patient: Melissa, [name] DOB: 06-09-1927 Phone: [phone] Reason for Call: Calling for a verbal Diabetic supplies Rx or to verify that the form she faxed over was received. Calling from [employer]. Regarding Appointment: Appt Date: Appt Time: Unknown Provider: Reason: Details: Outcome:

## 2011-08-30 ENCOUNTER — Other Ambulatory Visit: Payer: Medicare HMO

## 2011-09-03 ENCOUNTER — Telehealth: Payer: Self-pay | Admitting: Internal Medicine

## 2011-09-03 NOTE — Telephone Encounter (Addendum)
[facility] pharmacy called to check on status of getting pts diabetic testing supplies refilled. [facility] said that they have sent over a request, but said that they could send over another request if needed.

## 2011-09-06 ENCOUNTER — Other Ambulatory Visit: Payer: Medicare HMO

## 2011-09-06 ENCOUNTER — Ambulatory Visit
Admission: RE | Admit: 2011-09-06 | Discharge: 2011-09-06 | Disposition: A | Discharge status: Home / Self Care | Payer: Medicare HMO | Source: Ambulatory Visit | Attending: Neurology | Admitting: Neurology

## 2011-09-06 DIAGNOSIS — F09 Unspecified mental disorder due to known physiological condition: Secondary | ICD-10-CM

## 2011-09-06 DIAGNOSIS — R269 Unspecified abnormalities of gait and mobility: Secondary | ICD-10-CM

## 2011-09-06 DIAGNOSIS — E1142 Type 2 diabetes mellitus with diabetic polyneuropathy: Secondary | ICD-10-CM

## 2011-09-10 NOTE — Telephone Encounter (Signed)
Sent to [facility] on 09/07/11.

## 2011-09-19 ENCOUNTER — Telehealth: Payer: Self-pay | Admitting: Internal Medicine

## 2011-09-19 NOTE — Telephone Encounter (Signed)
Left message for Monica to call back.

## 2011-09-19 NOTE — Telephone Encounter (Signed)
Caller: Monica/Child; PCP: Madelin Headings.; CB#: [phone]; ; ; Call regarding Help With Living Facility Placement Form;  Daughter will drop form off.  Pt would like to discuss Pt's info w/ MD before form is filled out.

## 2011-09-20 NOTE — Telephone Encounter (Signed)
Spoke to [place].  She will bring the paperwork for the assisted living facility and a copy of the POA form with her on 09/21/11.  She will drop these off.  WP will decided after looking at the forms whether or not the pt needs an OV.

## 2011-10-24 ENCOUNTER — Other Ambulatory Visit: Payer: Self-pay

## 2011-10-24 DIAGNOSIS — N184 Chronic kidney disease, stage 4 (severe): Secondary | ICD-10-CM

## 2011-10-24 DIAGNOSIS — Z01810 Encounter for preprocedural cardiovascular examination: Secondary | ICD-10-CM

## 2011-11-06 ENCOUNTER — Telehealth: Payer: Self-pay | Admitting: Internal Medicine

## 2011-11-06 NOTE — Telephone Encounter (Signed)
Requestor is aware of 7-10 business day turn around period.

## 2011-11-10 ENCOUNTER — Other Ambulatory Visit: Payer: Self-pay | Admitting: Internal Medicine

## 2011-11-19 ENCOUNTER — Encounter: Payer: Self-pay | Admitting: Vascular Surgery

## 2011-11-19 ENCOUNTER — Ambulatory Visit: Payer: Medicare HMO | Admitting: Cardiovascular Disease

## 2011-11-20 ENCOUNTER — Encounter (INDEPENDENT_AMBULATORY_CARE_PROVIDER_SITE_OTHER): Payer: Medicare HMO

## 2011-11-20 ENCOUNTER — Encounter: Payer: Self-pay | Admitting: Vascular Surgery

## 2011-11-20 ENCOUNTER — Ambulatory Visit (INDEPENDENT_AMBULATORY_CARE_PROVIDER_SITE_OTHER): Payer: Medicare HMO | Admitting: Vascular Surgery

## 2011-11-20 VITALS — BP 133/102 | HR 76 | Resp 16 | Ht 66.0 in | Wt 176.0 lb

## 2011-11-20 DIAGNOSIS — N184 Chronic kidney disease, stage 4 (severe): Secondary | ICD-10-CM

## 2011-11-20 DIAGNOSIS — Z01810 Encounter for preprocedural cardiovascular examination: Secondary | ICD-10-CM

## 2011-11-20 DIAGNOSIS — N186 End stage renal disease: Secondary | ICD-10-CM | POA: Insufficient documentation
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
## 2011-11-30 NOTE — Telephone Encounter (Signed)
Front desk Bronson Ing) received call from Mr. Rolly Salter voicing his dissatisfaction with not receiving records per the subpoenaed request. Jeanice Lim from [facility] was called to verify [facility] processed request on 8/15, 86 pages were mailed to [facility], attention [facility] [place]. Called Mr. Rolly Salter to update him on the progress of the request and had to leave a voice message. 11/30/11 rmf

## 2011-12-05 ENCOUNTER — Telehealth: Payer: Self-pay | Admitting: Internal Medicine

## 2011-12-05 ENCOUNTER — Encounter: Payer: Self-pay | Admitting: Internal Medicine

## 2011-12-05 NOTE — Telephone Encounter (Signed)
Pt scheduled for 12:30 on 01/09/12.

## 2011-12-05 NOTE — Telephone Encounter (Signed)
Pt called and was wondering when she needs to come back in for another ov? Pt said that she rcvd a card in the mail that said that she had an ov with Dr [name] on 12/25/11, but there are no future appts schd for pt. Pls advise.

## 2012-01-07 ENCOUNTER — Telehealth: Payer: Self-pay | Admitting: Internal Medicine

## 2012-01-07 NOTE — Telephone Encounter (Signed)
Pt's daughter calling to speak with Dr. Rosezella [place] nurse in regards to mother's appointment.  States she is not sure how pt will get to upcoming appointment because the children have decided to not get involved in patient's treatment since they are unable to get guardianship and get the patient the assistance she needs.

## 2012-01-09 ENCOUNTER — Ambulatory Visit: Payer: Medicare HMO | Admitting: Internal Medicine

## 2012-01-18 ENCOUNTER — Encounter: Payer: Self-pay | Admitting: Internal Medicine

## 2012-01-18 ENCOUNTER — Ambulatory Visit (INDEPENDENT_AMBULATORY_CARE_PROVIDER_SITE_OTHER): Payer: Medicare HMO | Admitting: Internal Medicine

## 2012-01-18 VITALS — BP 158/90 | HR 89 | Temp 98.0°F | Wt 166.0 lb

## 2012-01-18 DIAGNOSIS — E1121 Type 2 diabetes mellitus with diabetic nephropathy: Secondary | ICD-10-CM

## 2012-01-18 DIAGNOSIS — E785 Hyperlipidemia, unspecified: Secondary | ICD-10-CM

## 2012-01-18 DIAGNOSIS — N259 Disorder resulting from impaired renal tubular function, unspecified: Secondary | ICD-10-CM

## 2012-01-18 DIAGNOSIS — R7989 Other specified abnormal findings of blood chemistry: Secondary | ICD-10-CM

## 2012-01-18 DIAGNOSIS — Z23 Encounter for immunization: Secondary | ICD-10-CM

## 2012-01-18 DIAGNOSIS — E1129 Type 2 diabetes mellitus with other diabetic kidney complication: Secondary | ICD-10-CM

## 2012-01-18 DIAGNOSIS — N390 Urinary tract infection, site not specified: Secondary | ICD-10-CM

## 2012-01-18 DIAGNOSIS — R6889 Other general symptoms and signs: Secondary | ICD-10-CM

## 2012-01-18 DIAGNOSIS — N186 End stage renal disease: Secondary | ICD-10-CM

## 2012-01-18 DIAGNOSIS — R413 Other amnesia: Secondary | ICD-10-CM

## 2012-01-18 DIAGNOSIS — N058 Unspecified nephritic syndrome with other morphologic changes: Secondary | ICD-10-CM

## 2012-01-18 DIAGNOSIS — I10 Essential (primary) hypertension: Secondary | ICD-10-CM

## 2012-01-18 MED ORDER — DONEPEZIL HCL 5 MG PO TABS: 5.0000 mg | ORAL_TABLET | Freq: Every evening | ORAL | Status: DC | PRN

## 2012-01-18 NOTE — Addendum Note (Signed)
Addended by: Kern Reap B on: 01/18/2012 05:18 PM   Modules accepted: Orders
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
## 2012-01-18 NOTE — Assessment & Plan Note (Signed)
Trial of aricept. 

## 2012-01-18 NOTE — Patient Instructions (Signed)
Will notify you  of labs when available. Concern about your blood pressure and   Kidneys. Keep appt with [name] . [name] may be helped by a memory  Medication  It can cause nausea when yu first start and the gets better with time.

## 2012-01-21 ENCOUNTER — Telehealth: Payer: Self-pay | Admitting: Internal Medicine

## 2012-01-21 NOTE — Telephone Encounter (Signed)
Did the pt ask you for a back brace?

## 2012-01-21 NOTE — Telephone Encounter (Signed)
Status of req for Back Brace for pt. Pls call.

## 2012-01-21 NOTE — Telephone Encounter (Signed)
I don't order  back braces.   A back specialist can advise and write rx. If they feel necessary for treatment.

## 2012-01-21 NOTE — Telephone Encounter (Signed)
Pt notified that [facility] does not do back braces.  She will need to see a specialist.

## 2012-02-04 ENCOUNTER — Encounter: Payer: Self-pay | Admitting: Internal Medicine

## 2012-02-11 ENCOUNTER — Telehealth: Payer: Self-pay | Admitting: Internal Medicine

## 2012-02-11 NOTE — Telephone Encounter (Signed)
Patient called stating that she is having difficulty getting transportation to her MD visits and she has  Talked with her insurance company Administrator) and they stated that the MD office needs to call them and provide medical necessity so that they will arrange transportation for her. Ph. Number [phone]. Please assist and inform patient when done.

## 2012-02-15 ENCOUNTER — Telehealth: Payer: Self-pay | Admitting: Internal Medicine

## 2012-02-15 NOTE — Telephone Encounter (Signed)
Checking on status of req for pts Diabetic Testing Supplies. Faxed a req several times, but have not gotten a response.

## 2012-02-18 ENCOUNTER — Encounter: Payer: Self-pay | Admitting: Internal Medicine

## 2012-02-18 NOTE — Telephone Encounter (Signed)
Letter has been done 

## 2012-02-19 NOTE — Telephone Encounter (Signed)
Have received numerous requests from different companies for the diabetic testing supplies.  Tried reaching the pt to ask which company is correct.  Both her cell and home phone have been disconnected.  Will try again at a later time.

## 2012-02-19 NOTE — Telephone Encounter (Signed)
Tried reaching the pt on both her cell and home number.  Both have been disconnected.  I will mail the letter to the pt.  Made a copy for medical records to scan in her chart.

## 2012-02-20 NOTE — Telephone Encounter (Signed)
Received paperwork from [employer].  [facility] signed and I faxed it in.  Received confirmation that it was received.

## 2012-02-20 NOTE — Telephone Encounter (Signed)
[employer] [facility] called back to check on req for diabetic testing supplies.

## 2012-03-11 ENCOUNTER — Other Ambulatory Visit: Payer: Self-pay | Admitting: Internal Medicine

## 2012-03-17 ENCOUNTER — Telehealth: Payer: Self-pay | Admitting: Internal Medicine

## 2012-03-17 NOTE — Telephone Encounter (Signed)
Spoke to [place] and informed her that the pt will need to see a specialist for a back/knee brace.

## 2012-03-17 NOTE — Telephone Encounter (Signed)
Company following up on order for back brace, faxed 03/14/12. Lelon Mast also wanted to talk to someone about getting knee brace approved. Order sent back denied.

## 2012-03-24 ENCOUNTER — Encounter: Payer: Self-pay | Admitting: Internal Medicine

## 2012-03-24 ENCOUNTER — Telehealth: Payer: Self-pay | Admitting: Family Medicine

## 2012-03-24 ENCOUNTER — Ambulatory Visit (INDEPENDENT_AMBULATORY_CARE_PROVIDER_SITE_OTHER): Payer: Medicare HMO | Admitting: Internal Medicine

## 2012-03-24 VITALS — BP 112/70 | HR 80 | Temp 97.7°F | Wt 163.0 lb

## 2012-03-24 DIAGNOSIS — E1129 Type 2 diabetes mellitus with other diabetic kidney complication: Secondary | ICD-10-CM

## 2012-03-24 DIAGNOSIS — E1121 Type 2 diabetes mellitus with diabetic nephropathy: Secondary | ICD-10-CM

## 2012-03-24 DIAGNOSIS — N390 Urinary tract infection, site not specified: Secondary | ICD-10-CM

## 2012-03-24 DIAGNOSIS — N259 Disorder resulting from impaired renal tubular function, unspecified: Secondary | ICD-10-CM

## 2012-03-24 DIAGNOSIS — N058 Unspecified nephritic syndrome with other morphologic changes: Secondary | ICD-10-CM

## 2012-03-24 DIAGNOSIS — R413 Other amnesia: Secondary | ICD-10-CM

## 2012-03-24 LAB — BASIC METABOLIC PANEL
BUN: 70 mg/dL — ABNORMAL HIGH (ref 6–23)
Calcium: 9.5 mg/dL (ref 8.4–10.5)
Creatinine, Ser: 5.7 mg/dL (ref 0.4–1.2)
GFR: 9.21 mL/min — CL (ref >60.00)
Glucose, Bld: 98 mg/dL (ref 70–99)
Sodium: 139 mEq/L (ref 135–145)

## 2012-03-24 NOTE — Telephone Encounter (Signed)
Critical labs called by Archie Patten.    Creatine: 5.7 GFR: 9.21 Potassium: 3.9
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
## 2012-03-24 NOTE — Patient Instructions (Addendum)
It is possible that your sugars could go low .  And  Want to avoid low blood sugars   Check sugars   Once a day for 1-2 weeks   And if too low  . Below 100  Then stop the glucotrol.  Glipizide . Taking the memory medicine works best if taking every day .     Will notify you  of labs when available.  ROV in 3 months .

## 2012-03-25 LAB — CBC WITH DIFFERENTIAL/PLATELET
Basophils Absolute: 0 10*3/uL (ref 0.0–0.1)
Eosinophils Absolute: 0.1 10*3/uL (ref 0.0–0.7)
Hemoglobin: 13.3 g/dL (ref 12.0–15.0)
Lymphocytes Relative: 21.7 % (ref 12.0–46.0)
Lymphs Abs: 1.1 10*3/uL (ref 0.7–4.0)
MCHC: 32.7 g/dL (ref 30.0–36.0)
MCV: 88.3 fl (ref 78.0–100.0)
Monocytes Absolute: 0.4 10*3/uL (ref 0.1–1.0)
Neutro Abs: 3.4 10*3/uL (ref 1.4–7.7)
RDW: 14.1 % (ref 11.5–14.6)

## 2012-03-25 NOTE — Telephone Encounter (Signed)
Results available  to day   Lab Results  Component Value Date   WBC 5.0 03/24/2012   HGB 13.3 03/24/2012   HCT 40.8 03/24/2012   PLT 160.0 03/24/2012   GLUCOSE 98 03/24/2012   CHOL 190 12/06/2010   TRIG 108.0 12/06/2010   HDL 66.30 12/06/2010   LDLCALC 102* 12/06/2010   ALT 12 12/06/2010   AST 21 12/06/2010   NA 139 03/24/2012   K 3.9 03/24/2012   CL 101 03/24/2012   CREATININE 5.7* 03/24/2012   BUN 70* 03/24/2012   CO2 27 03/24/2012   TSH 2.28 03/24/2012   INR 1.53* 06/26/2010   HGBA1C 6.0 03/24/2012    Called Dr Gwenlyn Perking office and  Was told Dr Blinda Leatherwood would call back   . No contact since then will call Jan 2nd or earlier. Pt was stable when seen the other day.

## 2012-03-27 NOTE — Telephone Encounter (Signed)
Tried reaching the pt by telephone.  Called home number and no answering machine available.  Cell phone is not in working order.

## 2012-03-27 NOTE — Telephone Encounter (Signed)
Tell pt that her kidney function is worse . Sugars are good to low  Stop the  Glipizide /  We need to get her  To Dr Kathrene Bongo  Office .  I personally called to speak with  A doc  dec 31 and no one called back. Get her an appt with Renal asap .   Arrange for repeat BMP asap  Tomorrow  Or at kidney office .

## 2012-03-28 NOTE — Telephone Encounter (Signed)
Spoke to the pt and informed her that we will make her an appt at Dr. Daiva Nakayama office.  Instructed her to stop the glipizide.  Called over to nephrology and left a message on Nora's machine (assistant to Dr. Kathrene Bongo) that the pt's kidney function has declined and she will need an appt ASAP.  She does have an appt already scheduled for 05/07/12 but will need to be seen sooner.  Informed her I will fax over labs for the dr to review.  Also, asked her to call me back when the new appt was made.  Gave my name, phone number and personal extension.

## 2012-03-28 NOTE — Telephone Encounter (Signed)
Labs faxed.  Waiting on response.

## 2012-04-01 NOTE — Telephone Encounter (Signed)
Arna Medici called back and left a message on my machine.  She has been trying to reach the pt by telephone.  Has been unsuccessful.  Their plan is to see if the patient is symptomatic.  If she is then they will see her sooner than February.  If not, than she will keep her scheduled appt..  Please advise if you have further instructions.

## 2012-04-11 ENCOUNTER — Ambulatory Visit (INDEPENDENT_AMBULATORY_CARE_PROVIDER_SITE_OTHER): Payer: Medicare HMO | Admitting: Cardiovascular Disease

## 2012-04-11 ENCOUNTER — Encounter: Payer: Self-pay | Admitting: Cardiovascular Disease

## 2012-04-11 ENCOUNTER — Other Ambulatory Visit: Payer: Self-pay | Admitting: Internal Medicine

## 2012-04-11 VITALS — BP 96/64 | HR 82 | Ht 66.0 in | Wt 163.0 lb

## 2012-04-11 DIAGNOSIS — I10 Essential (primary) hypertension: Secondary | ICD-10-CM

## 2012-04-11 DIAGNOSIS — N186 End stage renal disease: Secondary | ICD-10-CM
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
## 2012-04-11 NOTE — Assessment & Plan Note (Signed)
[name] has an acute worsening of her renal function. She's not on any medications that would cause this.  That long discussion about this. She's not sure that she was to have dialysis. I told her that she  did not have to start dialysis but that she would likely have problems fairly soon and would likely die from complications of renal failure.  I've asked her to avoid eating foods that contain high potassium  such as oranges, bananas, tomatoes, and cantaloupe.  She has an appointment with a nephrologist in mid-February.

## 2012-04-11 NOTE — Assessment & Plan Note (Signed)
Her blood pressure is well-controlled at this point. She does have worsening renal function. She will be seeing the nephrologist next month.

## 2012-04-11 NOTE — Patient Instructions (Addendum)
Your physician wants you to follow-up in: 6 Months You will receive a reminder letter in the mail two months in advance. If you don't receive a letter, please call our office to schedule the follow-up appointment.  Your physician recommends that you continue on your current medications as directed. Please refer to the Current Medication list given to you today.   

## 2012-04-17 ENCOUNTER — Other Ambulatory Visit: Payer: Self-pay | Admitting: Internal Medicine

## 2012-04-22 ENCOUNTER — Other Ambulatory Visit: Payer: Self-pay | Admitting: Family Medicine

## 2012-04-22 MED ORDER — DONEPEZIL HCL 5 MG PO TABS: 5.0000 mg | ORAL_TABLET | Freq: Every evening | ORAL | Status: DC | PRN

## 2012-04-22 MED ORDER — AMLODIPINE BESYLATE 5 MG PO TABS: 5.0000 mg | ORAL_TABLET | Freq: Every day | ORAL | Status: DC

## 2012-04-22 MED ORDER — GLIPIZIDE ER 2.5 MG PO TB24: 2.5000 mg | ORAL_TABLET | Freq: Every day | ORAL | Status: DC

## 2012-04-23 ENCOUNTER — Ambulatory Visit: Payer: Medicare HMO | Admitting: Internal Medicine

## 2012-04-23 ENCOUNTER — Institutional Professional Consult (permissible substitution): Payer: Medicare HMO | Admitting: Internal Medicine

## 2012-04-30 ENCOUNTER — Encounter: Payer: Self-pay | Admitting: Internal Medicine

## 2012-04-30 ENCOUNTER — Ambulatory Visit (INDEPENDENT_AMBULATORY_CARE_PROVIDER_SITE_OTHER): Payer: PRIVATE HEALTH INSURANCE | Admitting: Internal Medicine

## 2012-04-30 VITALS — BP 114/80 | HR 80 | Temp 97.5°F | Wt 163.0 lb

## 2012-04-30 DIAGNOSIS — E1129 Type 2 diabetes mellitus with other diabetic kidney complication: Secondary | ICD-10-CM

## 2012-04-30 DIAGNOSIS — N19 Unspecified kidney failure: Secondary | ICD-10-CM

## 2012-04-30 DIAGNOSIS — M79609 Pain in unspecified limb: Secondary | ICD-10-CM

## 2012-04-30 DIAGNOSIS — N058 Unspecified nephritic syndrome with other morphologic changes: Secondary | ICD-10-CM

## 2012-04-30 DIAGNOSIS — R413 Other amnesia: Secondary | ICD-10-CM

## 2012-04-30 DIAGNOSIS — E1121 Type 2 diabetes mellitus with diabetic nephropathy: Secondary | ICD-10-CM

## 2012-04-30 LAB — BASIC METABOLIC PANEL
BUN: 68 mg/dL — ABNORMAL HIGH (ref 6–23)
Calcium: 9.7 mg/dL (ref 8.4–10.5)
Creatinine, Ser: 4.2 mg/dL — ABNORMAL HIGH (ref 0.4–1.2)
GFR: 12.96 mL/min — CL (ref >60.00)

## 2012-04-30 NOTE — Patient Instructions (Addendum)
  Your knee may be a bit of arthritis   Avoid kneeing on this for now and can use tylenol  325 mg 1-2 every 6 hours as needed.  DO NOT USE IBUPROFEN OR ALEVE type medications as this can  make your kidneys worse.  If getting worse we can x ray this and get an orthopedist to evaluate your knee. Back braces are not that helpful and not advised unless by back specialist .   STOP THE GLUCOTROL   To avoid BP getting too low .  TRY taking the ARICEPT EVERY NIGHT   And see if memory is better after a month.   Will notify you  of labs when available.   And need to follow up with the kidney doctor.  DISCUSS THE  Situation with dialysis   And the shunt placement  Then plan follow up.
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
## 2012-05-01 DIAGNOSIS — N19 Unspecified kidney failure: Secondary | ICD-10-CM | POA: Insufficient documentation

## 2012-05-16 ENCOUNTER — Encounter: Payer: Self-pay | Admitting: Family Medicine

## 2012-06-03 ENCOUNTER — Telehealth: Payer: Self-pay | Admitting: Internal Medicine

## 2012-06-03 NOTE — Telephone Encounter (Signed)
The patient is not insulin dependent.  How many times should she be checking her bs?

## 2012-06-03 NOTE — Telephone Encounter (Signed)
She is not on insulin but has renal failure.  She had been on twice a day cause of low bg and  Renal failure  At this time she can change to once a day testing. ( tell pt this also )

## 2012-06-03 NOTE — Telephone Encounter (Signed)
Fletcher Anon would like to know how many time a day this pt checking her BS and is she insulin dependent.

## 2012-06-03 NOTE — Telephone Encounter (Signed)
Correction pt pharm is [facility]. Pt pharm faxed form over the form in MD folder

## 2012-06-04 ENCOUNTER — Telehealth: Payer: Self-pay | Admitting: Vascular Surgery

## 2012-06-04 ENCOUNTER — Encounter: Payer: Self-pay | Admitting: Internal Medicine

## 2012-06-04 ENCOUNTER — Ambulatory Visit (INDEPENDENT_AMBULATORY_CARE_PROVIDER_SITE_OTHER): Payer: Medicare Other | Admitting: Internal Medicine

## 2012-06-04 VITALS — BP 140/84 | HR 80 | Temp 97.9°F | Wt 165.0 lb

## 2012-06-04 DIAGNOSIS — E1129 Type 2 diabetes mellitus with other diabetic kidney complication: Secondary | ICD-10-CM

## 2012-06-04 DIAGNOSIS — N058 Unspecified nephritic syndrome with other morphologic changes: Secondary | ICD-10-CM

## 2012-06-04 DIAGNOSIS — R413 Other amnesia: Secondary | ICD-10-CM

## 2012-06-04 DIAGNOSIS — I10 Essential (primary) hypertension: Secondary | ICD-10-CM

## 2012-06-04 DIAGNOSIS — E1121 Type 2 diabetes mellitus with diabetic nephropathy: Secondary | ICD-10-CM

## 2012-06-04 DIAGNOSIS — N390 Urinary tract infection, site not specified: Secondary | ICD-10-CM

## 2012-06-04 DIAGNOSIS — N19 Unspecified kidney failure: Secondary | ICD-10-CM

## 2012-06-04 DIAGNOSIS — M6530 Trigger finger, unspecified finger: Secondary | ICD-10-CM

## 2012-06-04 LAB — GLUCOSE, POCT (MANUAL RESULT ENTRY): POC Glucose: 93 mg/dl (ref 70–99)

## 2012-06-04 NOTE — Telephone Encounter (Signed)
[name] given appt info-  She will notify pt - kf

## 2012-06-04 NOTE — Telephone Encounter (Signed)
Pt has appt today.  Will speak to her at that time.
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
## 2012-06-04 NOTE — Telephone Encounter (Signed)
Message copied by Margaretmary Eddy on Wed Jun 04, 2012  3:27 PM ------      Message from: Phillips Odor      Created: Wed Jun 04, 2012 11:22 AM      Regarding: needs appt. soon with [facility]FE       Nora/Dr. Jon Gills office is requesting pt. Be scheduled for an AVG as soon as possible.  Can we place her on Dr. Bosie Helper schedule on 3/18 @ 9:30 AM/ Georgette Dover is on at 9:30 AM, and we should cancel that appt., as he was scheduled for a thrombectomy today, and got admitted to the hospital.(we would need to notify someone of cancelling this appt.) Please call Arna Medici @ [facility] at [phone], ext. [phone] of appt./ Thx. (pt. had vein mapping done in August, so doesn't need repeated) ------

## 2012-06-04 NOTE — Patient Instructions (Addendum)
Can check blood sugar once a day instead of twice  .  Continue to stay off of the glipizide.    Your sugars are good now.   No change in other medicines  You seem to have a trigger finger . If gets worse  We can get a hand specialsit to see you sometimes a cortisone injection can help.      ROV in 2 months and do labs  . Before that visit unless other doctor does this

## 2012-06-04 NOTE — Telephone Encounter (Signed)
Pt chose [facility].

## 2012-06-05 ENCOUNTER — Encounter: Payer: Self-pay | Admitting: Internal Medicine

## 2012-06-10 ENCOUNTER — Ambulatory Visit: Payer: Medicare Other | Admitting: Vascular Surgery

## 2012-06-16 ENCOUNTER — Encounter: Payer: Self-pay | Admitting: Vascular Surgery

## 2012-06-17 ENCOUNTER — Encounter: Payer: Self-pay | Admitting: Vascular Surgery

## 2012-06-17 ENCOUNTER — Ambulatory Visit (INDEPENDENT_AMBULATORY_CARE_PROVIDER_SITE_OTHER): Payer: Medicare Other | Admitting: Vascular Surgery

## 2012-06-17 VITALS — BP 120/80 | HR 72 | Ht 66.0 in | Wt 162.8 lb

## 2012-06-17 DIAGNOSIS — N186 End stage renal disease: Secondary | ICD-10-CM
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
## 2012-06-27 ENCOUNTER — Encounter: Payer: Self-pay | Admitting: Internal Medicine

## 2012-07-28 ENCOUNTER — Ambulatory Visit (INDEPENDENT_AMBULATORY_CARE_PROVIDER_SITE_OTHER): Payer: Medicare Other | Admitting: Internal Medicine

## 2012-07-28 ENCOUNTER — Encounter: Payer: Self-pay | Admitting: Internal Medicine

## 2012-07-28 VITALS — BP 104/74 | HR 84 | Temp 97.7°F | Wt 166.0 lb

## 2012-07-28 DIAGNOSIS — R413 Other amnesia: Secondary | ICD-10-CM

## 2012-07-28 DIAGNOSIS — N19 Unspecified kidney failure: Secondary | ICD-10-CM

## 2012-07-28 DIAGNOSIS — E1129 Type 2 diabetes mellitus with other diabetic kidney complication: Secondary | ICD-10-CM

## 2012-07-28 DIAGNOSIS — N058 Unspecified nephritic syndrome with other morphologic changes: Secondary | ICD-10-CM

## 2012-07-28 DIAGNOSIS — E1121 Type 2 diabetes mellitus with diabetic nephropathy: Secondary | ICD-10-CM

## 2012-07-28 DIAGNOSIS — N259 Disorder resulting from impaired renal tubular function, unspecified: Secondary | ICD-10-CM

## 2012-07-28 DIAGNOSIS — E119 Type 2 diabetes mellitus without complications: Secondary | ICD-10-CM

## 2012-07-28 LAB — GLUCOSE, POCT (MANUAL RESULT ENTRY): POC Glucose: 80 mg/dl (ref 70–99)
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
## 2012-07-28 NOTE — Patient Instructions (Signed)
Will notify you  of labs when available.   If ok plan OV in 4 months or as needed.

## 2012-07-29 LAB — BASIC METABOLIC PANEL
BUN: 45 mg/dL — ABNORMAL HIGH (ref 6–23)
Creatinine, Ser: 3.8 mg/dL — ABNORMAL HIGH (ref 0.4–1.2)
GFR: 14.58 mL/min — CL (ref >60.00)
Glucose, Bld: 79 mg/dL (ref 70–99)

## 2012-07-29 LAB — LIPID PANEL: Cholesterol: 162 mg/dL (ref 0–200)

## 2012-08-01 ENCOUNTER — Encounter: Payer: Self-pay | Admitting: Internal Medicine

## 2012-08-06 IMAGING — CR DG CERVICAL SPINE COMPLETE 4+V
5 series · 5 of 5 positions shown · non-contrast
Comparison: 11/15/2009

CLINICAL DATA: Fall.  Forehead hematoma.

CERVICAL SPINE - COMPLETE 4+ VIEW

[view not recorded (1 of 5)]
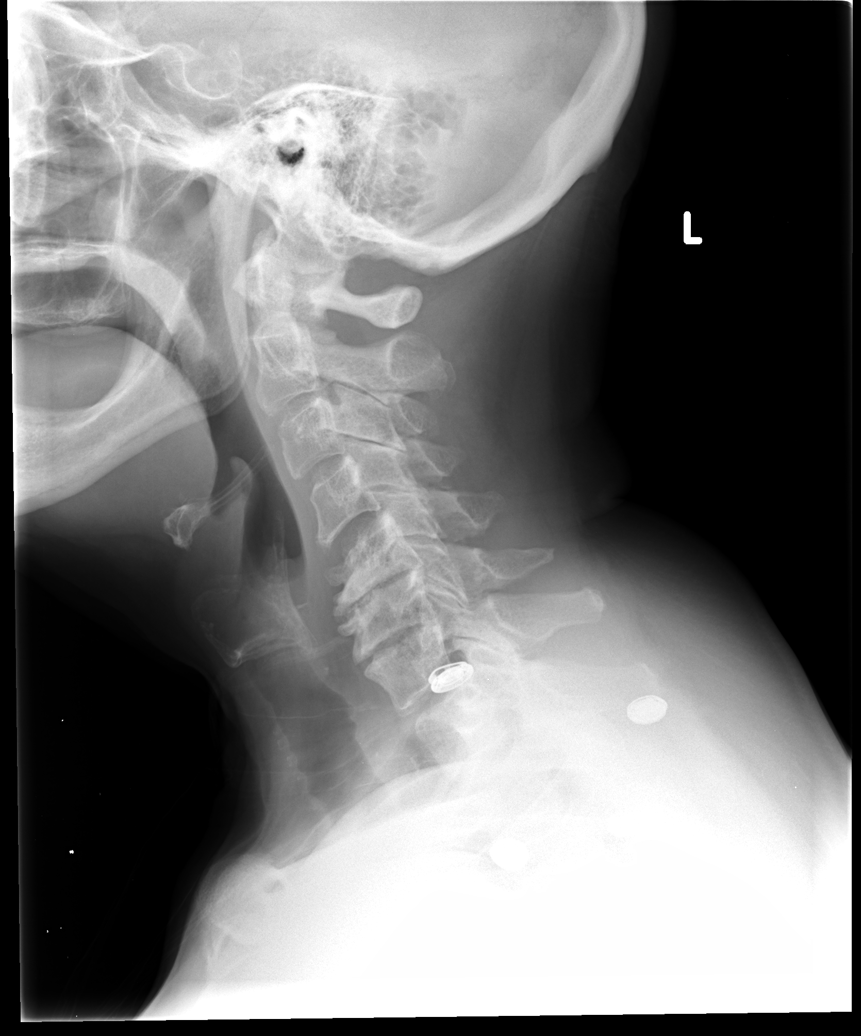

[view not recorded (2 of 5)]
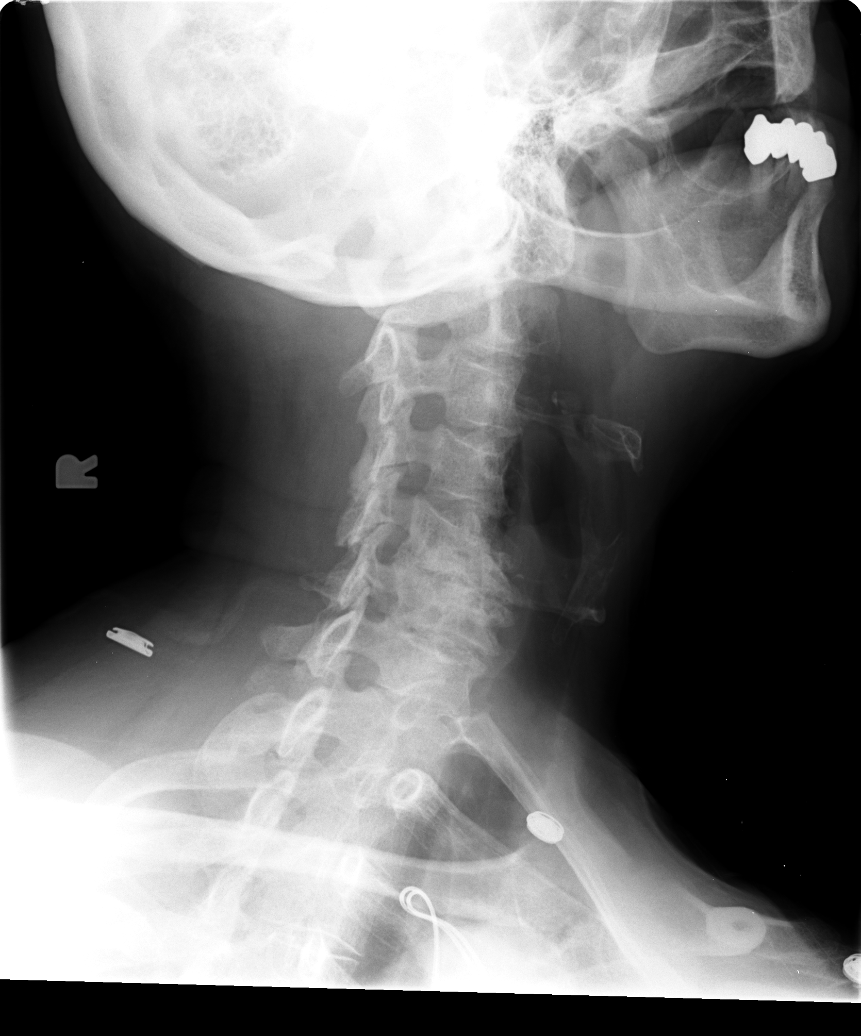

[view not recorded (3 of 5)]
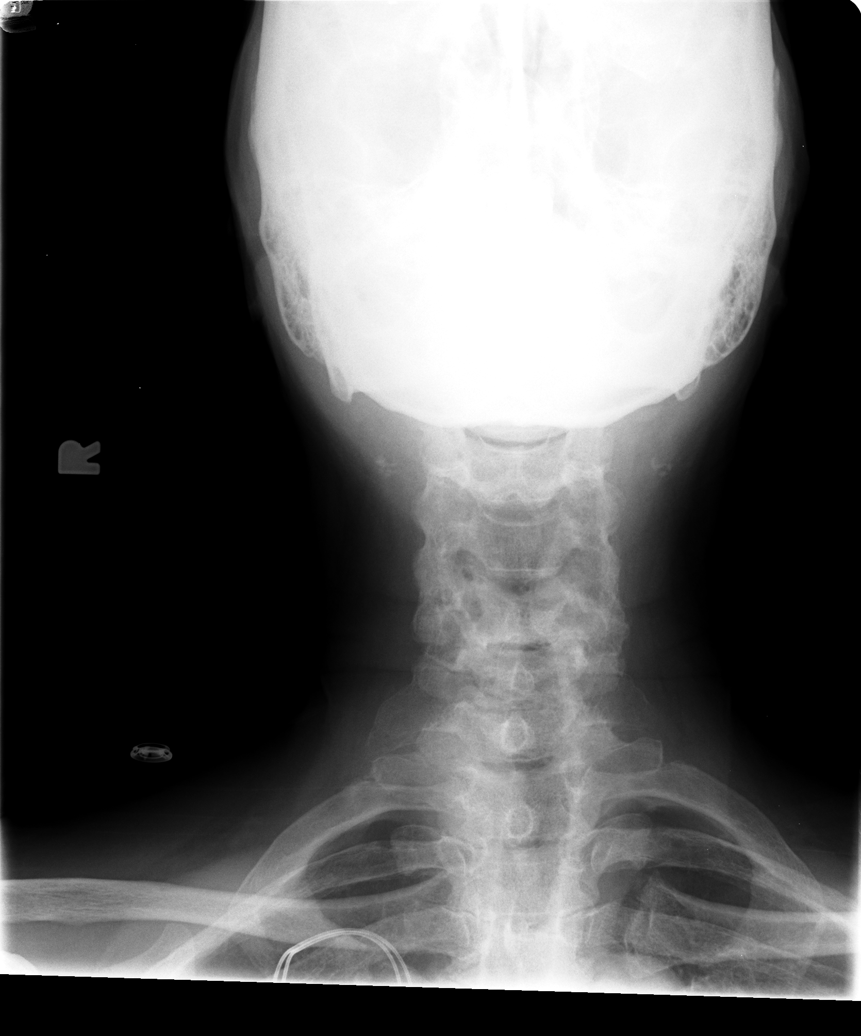

[view not recorded (4 of 5)]
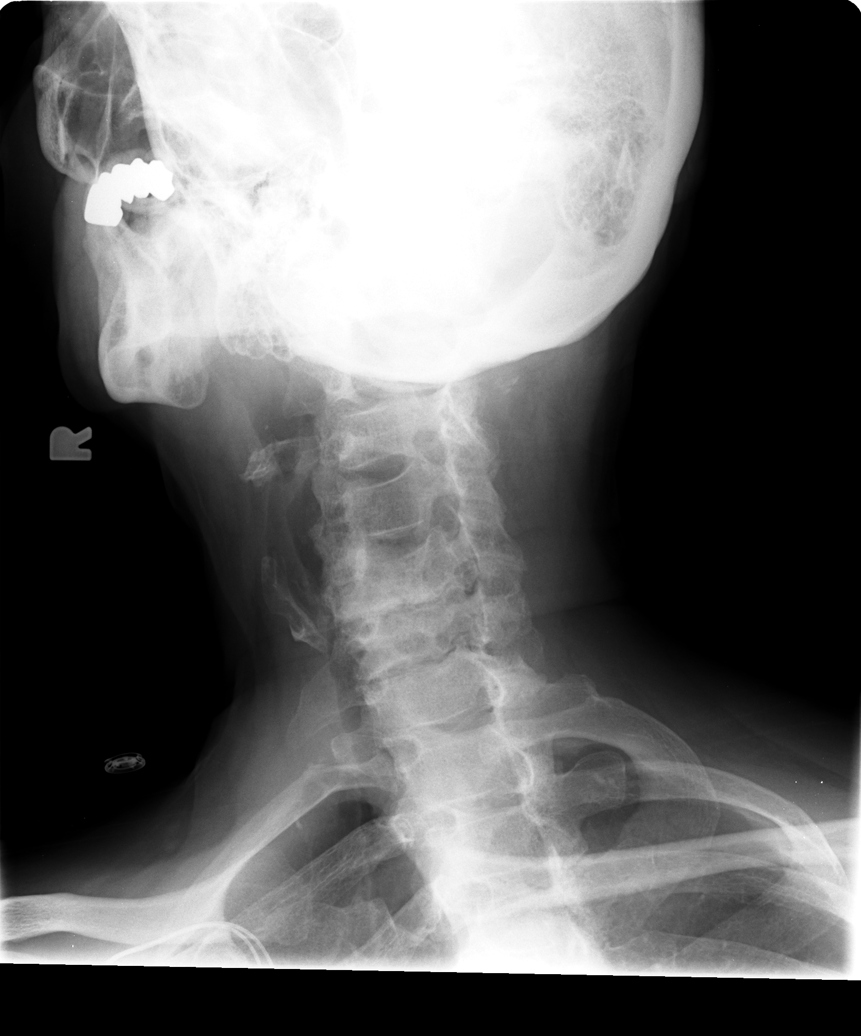

[view not recorded (5 of 5)]
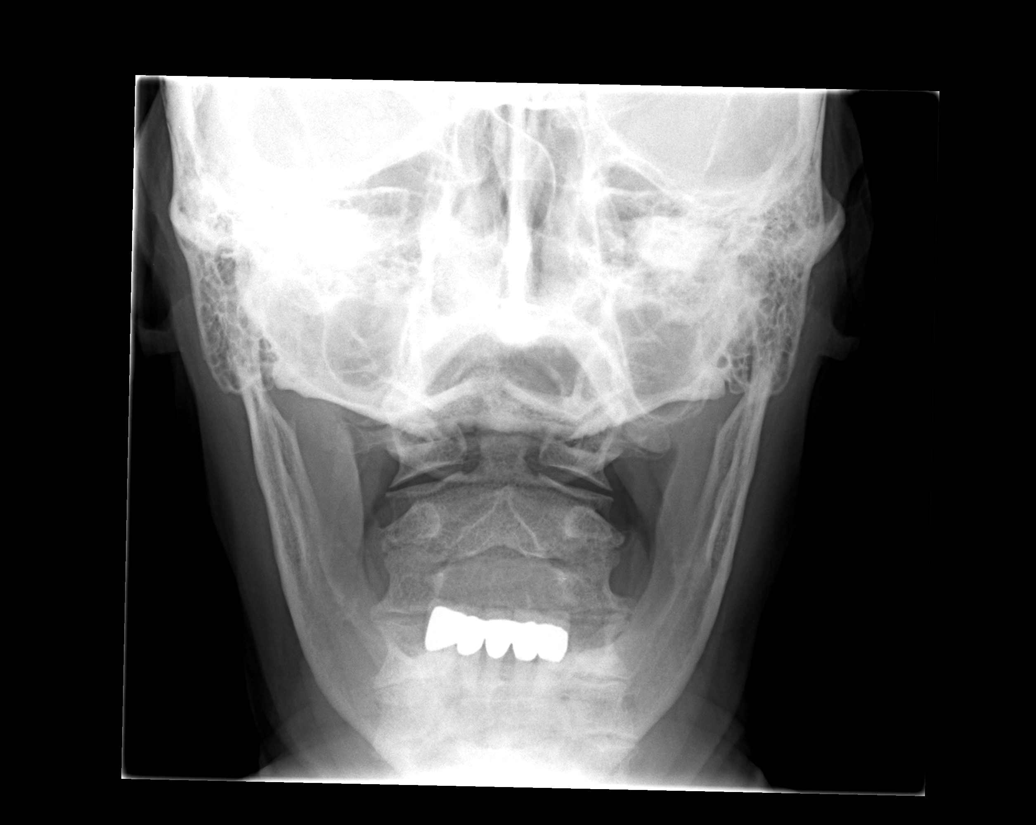

[5 of 5 positions shown; findings below may reference images not displayed]

FINDINGS: Severe cervical spondylosis is present at C5-C6 and C6-
C7.  2 mm retrolisthesis of C5 on C6 is unchanged from prior.
Prevertebral soft tissues are within normal limits. Bilateral, left
greater than right bony foraminal stenosis is present bilaterally
at C5-C6 and C6-C7.  Bilateral carotid atherosclerosis.  Partially
visualized pacemaker leads in the right upper chest.  Odontoid view
unremarkable.
IMPRESSION: Unchanged mid to lower cervical spondylosis without acute osseous
abnormality.

## 2012-08-09 IMAGING — CT CT HEAD W/O CM
1 series · 16 of 30 positions shown, 20 images · non-contrast
Comparison: 06/10/2010.

CLINICAL DATA: Subdural hematoma.  Fall 06/09/2010.

CT HEAD WITHOUT CONTRAST
TECHNIQUE: Contiguous axial images were obtained from the base of
the skull through the vertex without contrast.

[Series 2: head trauma 4.8 h37s · axial · 0.45mm/px · z∈[-165,-8]mm · 16 of 36 slices shown, 20 images]
[im 2/36  brain]
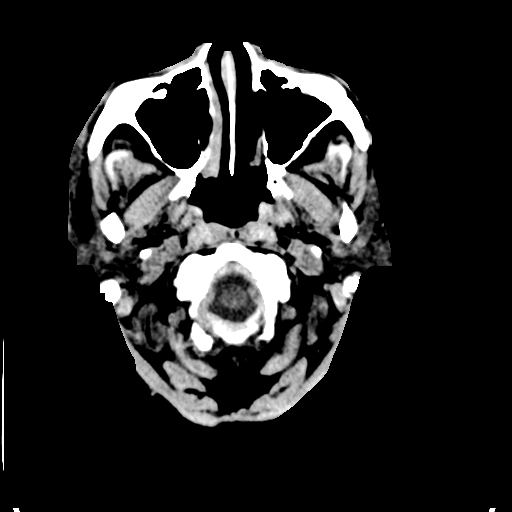
[im 2/36  bone]
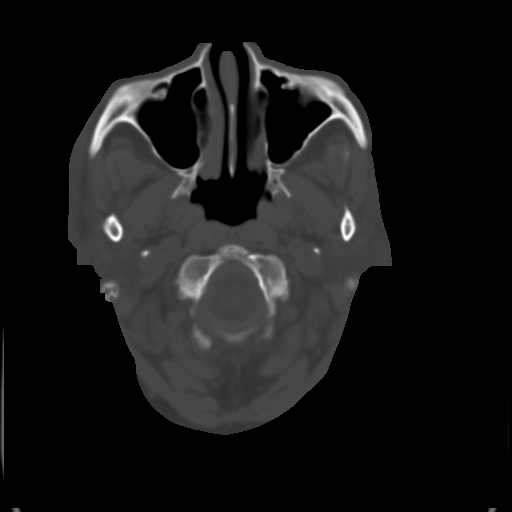
[im 4/36  brain]
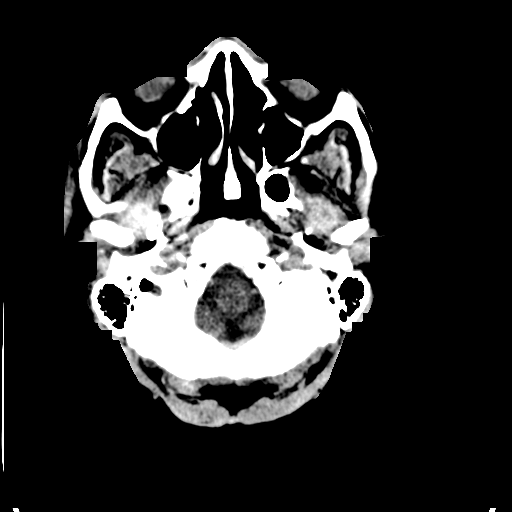
[im 7/36  brain]
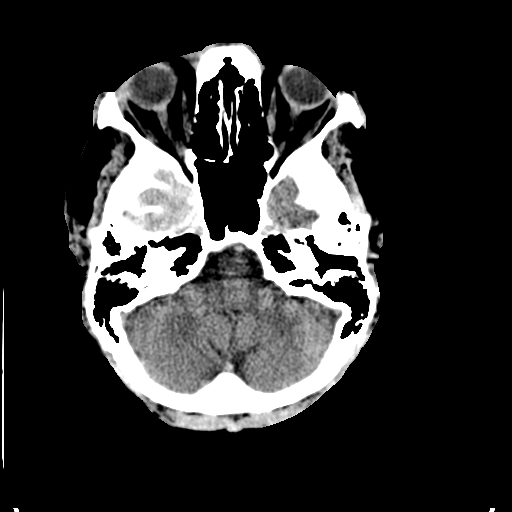
[im 9/36  brain]
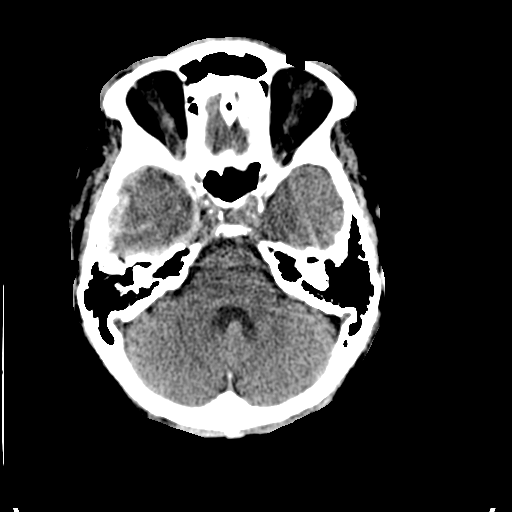
[im 10/36  brain]
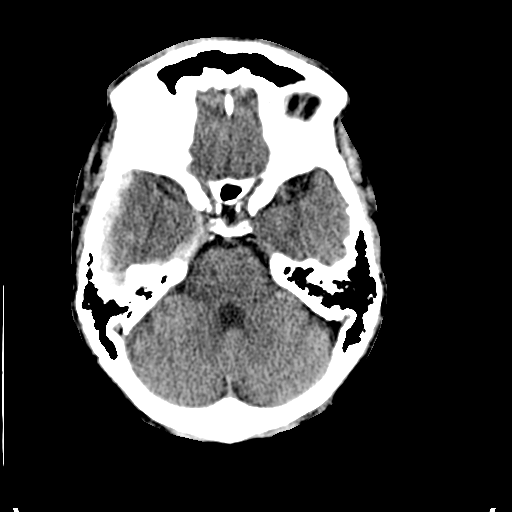
[im 10/36  bone]
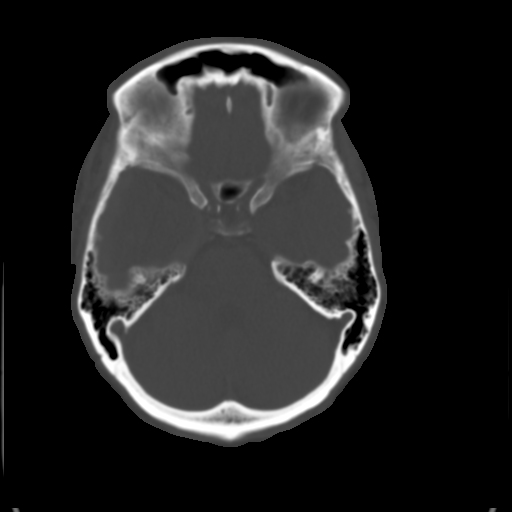
[im 13/36  brain]
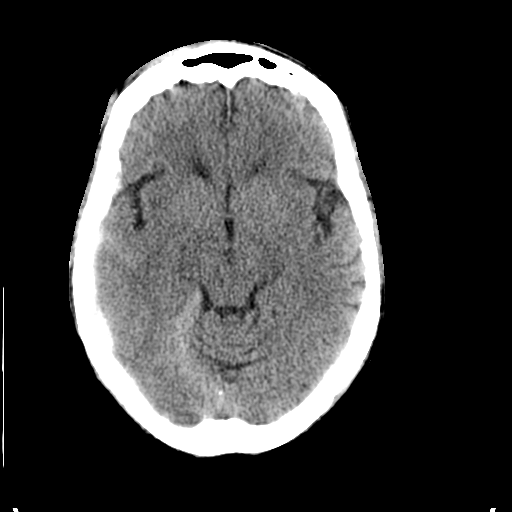
[im 15/36  brain]
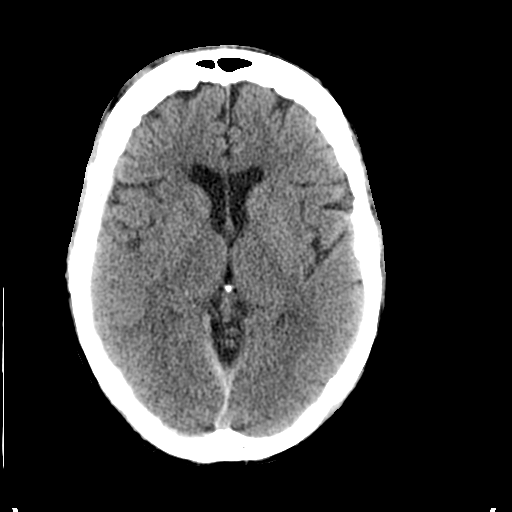
[im 17/36  brain]
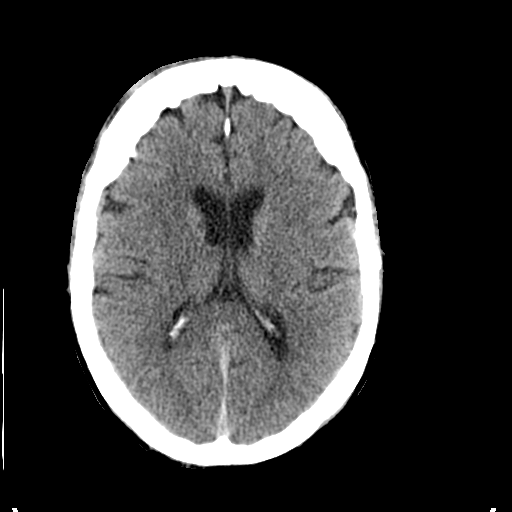
[im 19/36  brain]
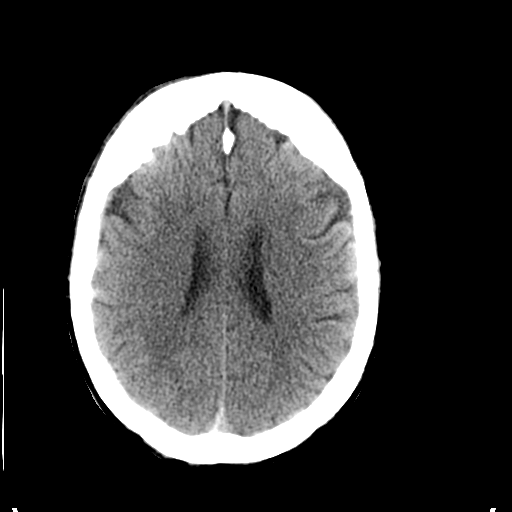
[im 19/36  bone]
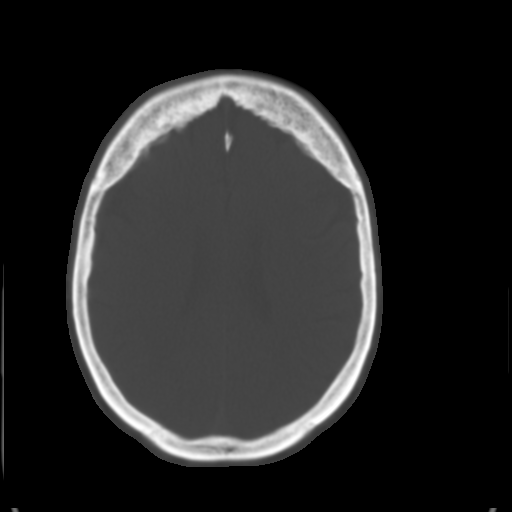
[im 21/36  brain]
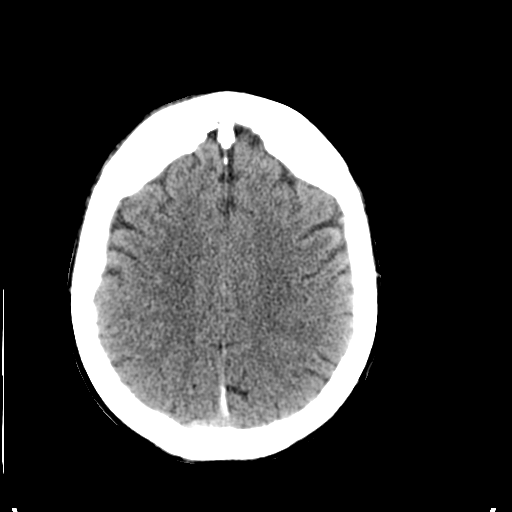
[im 23/36  brain]
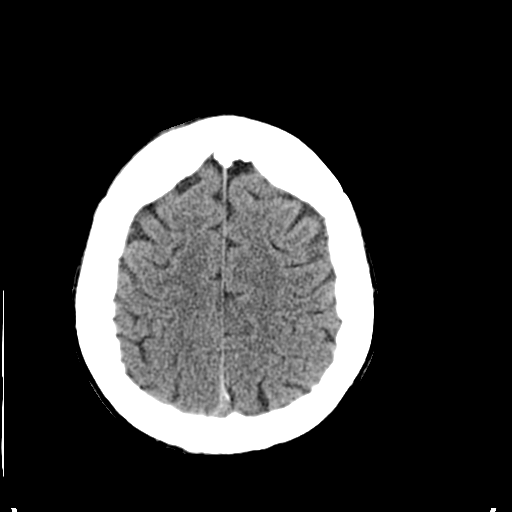
[im 26/36  brain]
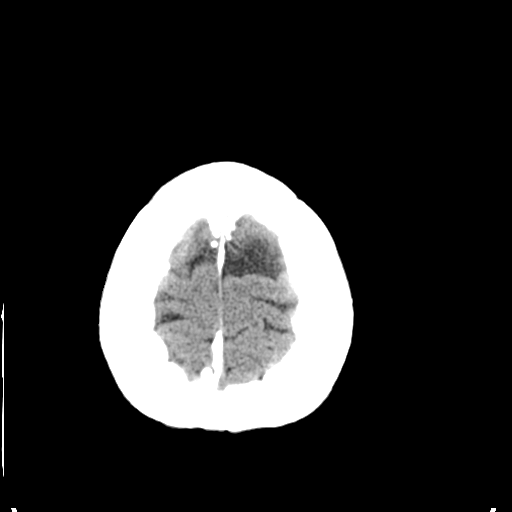
[im 27/36  brain]
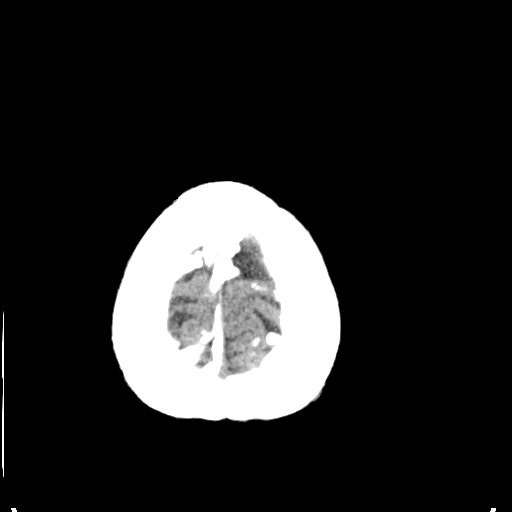
[im 27/36  bone]
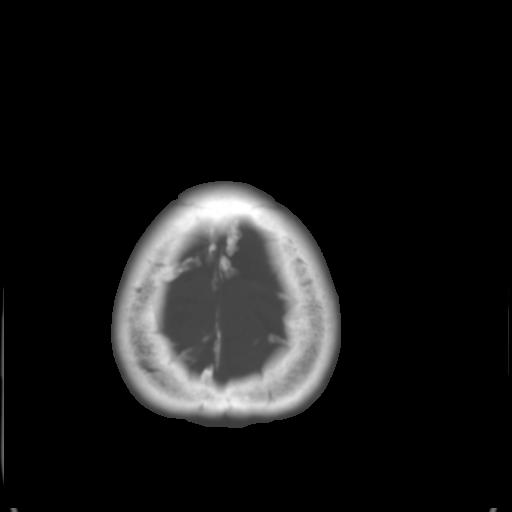
[im 29/36  brain]
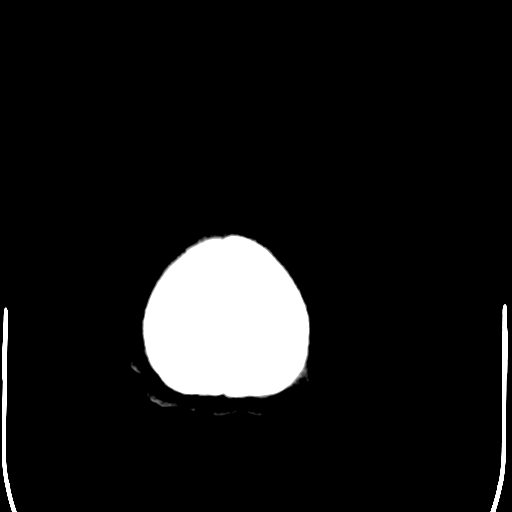
[im 32/36  brain]
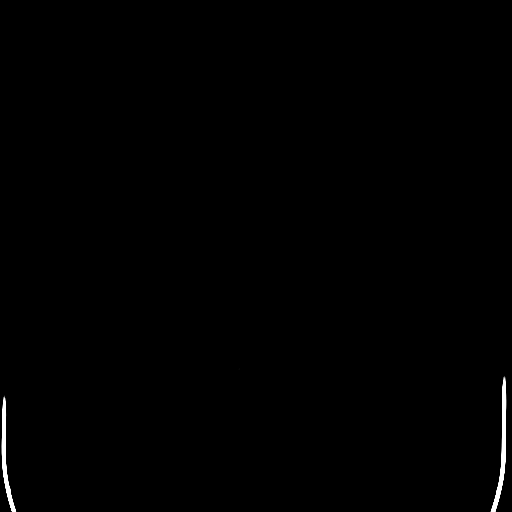
[im 34/36  brain]
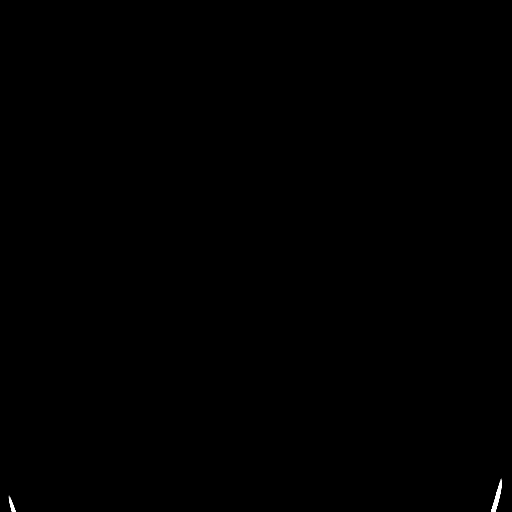

[16 of 30 positions shown; findings below may reference images not displayed]

FINDINGS: Broad-based right-sided subdural hematoma largest in the
temporal region and with maximal thickness of 6.1 mm similar to the
prior exam.  No significant change in the right tentorial and
parafalcine hematoma.

No CT evidence of large acute infarct.  Small acute infarct cannot
be excluded by CT. No intracranial mass detected on this
unenhanced exam.

Right preseptal and supraorbital hematoma without underlying
fracture.  Right lens appears to have been replaced.  Orbital
structures otherwise appear be grossly intact.
IMPRESSION: Broad-based right-sided subdural hematoma largest in the temporal
region and with maximal thickness of 6.1 mm similar to the prior
exam.  No significant change in the right tentorial and parafalcine
hematoma.

## 2012-08-25 ENCOUNTER — Ambulatory Visit (INDEPENDENT_AMBULATORY_CARE_PROVIDER_SITE_OTHER): Payer: Medicare Other | Admitting: Internal Medicine

## 2012-08-25 ENCOUNTER — Encounter: Payer: Self-pay | Admitting: Internal Medicine

## 2012-08-25 VITALS — BP 116/76 | HR 66 | Wt 164.0 lb

## 2012-08-25 DIAGNOSIS — I10 Essential (primary) hypertension: Secondary | ICD-10-CM

## 2012-08-25 DIAGNOSIS — I498 Other specified cardiac arrhythmias: Secondary | ICD-10-CM | POA: Insufficient documentation

## 2012-08-25 DIAGNOSIS — Z950 Presence of cardiac pacemaker: Secondary | ICD-10-CM

## 2012-08-25 LAB — PACEMAKER DEVICE OBSERVATION
AL AMPLITUDE: 4 mv
AL THRESHOLD: 1 V
ATRIAL PACING PM: 13
BAMS-0001: 175 {beats}/min
RV LEAD IMPEDENCE PM: 427 Ohm
RV LEAD THRESHOLD: 0.75 V
VENTRICULAR PACING PM: 0

## 2012-08-25 NOTE — Patient Instructions (Addendum)
Your physician wants you to follow-up in: 6 MONTHS with Paula for device check.  You will receive a reminder letter in the mail two months in advance. If you don't receive a letter, please call our office to schedule the follow-up appointment.  Your physician wants you to follow-up in: 1 YEAR with Dr Taylor.  You will receive a reminder letter in the mail two months in advance. If you don't receive a letter, please call our office to schedule the follow-up appointment.  Your physician recommends that you continue on your current medications as directed. Please refer to the Current Medication list given to you today.  

## 2012-08-25 NOTE — Assessment & Plan Note (Signed)
Her blood pressure is well controlled. She will continue her current medical therapy. 
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
## 2012-08-25 NOTE — Assessment & Plan Note (Signed)
Her medtronic DDD PPM has approximately 14 months of battery longevity. Will recheck in several months.

## 2012-08-27 ENCOUNTER — Emergency Department (HOSPITAL_COMMUNITY)
Admission: EM | Admit: 2012-08-27 | Discharge: 2012-08-27 | Disposition: A | Discharge status: Home / Self Care | Payer: Medicare Other | Attending: Emergency Medicine | Admitting: Emergency Medicine

## 2012-08-27 ENCOUNTER — Encounter (HOSPITAL_COMMUNITY): Payer: Self-pay | Admitting: Emergency Medicine

## 2012-08-27 DIAGNOSIS — J3489 Other specified disorders of nose and nasal sinuses: Secondary | ICD-10-CM | POA: Insufficient documentation

## 2012-08-27 DIAGNOSIS — I10 Essential (primary) hypertension: Secondary | ICD-10-CM | POA: Insufficient documentation

## 2012-08-27 DIAGNOSIS — J4489 Other specified chronic obstructive pulmonary disease: Secondary | ICD-10-CM | POA: Insufficient documentation

## 2012-08-27 DIAGNOSIS — Z8679 Personal history of other diseases of the circulatory system: Secondary | ICD-10-CM | POA: Insufficient documentation

## 2012-08-27 DIAGNOSIS — J449 Chronic obstructive pulmonary disease, unspecified: Secondary | ICD-10-CM | POA: Insufficient documentation

## 2012-08-27 DIAGNOSIS — Z8639 Personal history of other endocrine, nutritional and metabolic disease: Secondary | ICD-10-CM | POA: Insufficient documentation

## 2012-08-27 DIAGNOSIS — E669 Obesity, unspecified: Secondary | ICD-10-CM | POA: Insufficient documentation

## 2012-08-27 DIAGNOSIS — Z950 Presence of cardiac pacemaker: Secondary | ICD-10-CM | POA: Insufficient documentation

## 2012-08-27 DIAGNOSIS — E119 Type 2 diabetes mellitus without complications: Secondary | ICD-10-CM | POA: Insufficient documentation

## 2012-08-27 DIAGNOSIS — Z87891 Personal history of nicotine dependence: Secondary | ICD-10-CM | POA: Insufficient documentation

## 2012-08-27 DIAGNOSIS — E785 Hyperlipidemia, unspecified: Secondary | ICD-10-CM | POA: Insufficient documentation

## 2012-08-27 DIAGNOSIS — R112 Nausea with vomiting, unspecified: Secondary | ICD-10-CM | POA: Insufficient documentation

## 2012-08-27 DIAGNOSIS — Z7982 Long term (current) use of aspirin: Secondary | ICD-10-CM | POA: Insufficient documentation

## 2012-08-27 DIAGNOSIS — R1110 Vomiting, unspecified: Secondary | ICD-10-CM

## 2012-08-27 DIAGNOSIS — Z862 Personal history of diseases of the blood and blood-forming organs and certain disorders involving the immune mechanism: Secondary | ICD-10-CM | POA: Insufficient documentation

## 2012-08-27 DIAGNOSIS — Z79899 Other long term (current) drug therapy: Secondary | ICD-10-CM | POA: Insufficient documentation

## 2012-08-27 DIAGNOSIS — N390 Urinary tract infection, site not specified: Secondary | ICD-10-CM | POA: Insufficient documentation

## 2012-08-27 DIAGNOSIS — IMO0002 Reserved for concepts with insufficient information to code with codable children: Secondary | ICD-10-CM | POA: Insufficient documentation

## 2012-08-27 DIAGNOSIS — Z87828 Personal history of other (healed) physical injury and trauma: Secondary | ICD-10-CM | POA: Insufficient documentation

## 2012-08-27 DIAGNOSIS — Z8739 Personal history of other diseases of the musculoskeletal system and connective tissue: Secondary | ICD-10-CM | POA: Insufficient documentation

## 2012-08-27 DIAGNOSIS — Z87448 Personal history of other diseases of urinary system: Secondary | ICD-10-CM | POA: Insufficient documentation

## 2012-08-27 DIAGNOSIS — Z86718 Personal history of other venous thrombosis and embolism: Secondary | ICD-10-CM | POA: Insufficient documentation

## 2012-08-27 LAB — COMPREHENSIVE METABOLIC PANEL
ALT: 14 U/L (ref 0–35)
Alkaline Phosphatase: 90 U/L (ref 39–117)
BUN: 46 mg/dL — ABNORMAL HIGH (ref 6–23)
CO2: 27 mEq/L (ref 19–32)
Calcium: 9.8 mg/dL (ref 8.4–10.5)
GFR calc Af Amer: 12 mL/min — ABNORMAL LOW (ref >90)
GFR calc non Af Amer: 10 mL/min — ABNORMAL LOW (ref >90)
Glucose, Bld: 133 mg/dL — ABNORMAL HIGH (ref 70–99)
Sodium: 137 mEq/L (ref 135–145)
Total Protein: 8 g/dL (ref 6.0–8.3)

## 2012-08-27 LAB — URINALYSIS, ROUTINE W REFLEX MICROSCOPIC
Bilirubin Urine: NEGATIVE
Glucose, UA: NEGATIVE mg/dL
Nitrite: NEGATIVE
Specific Gravity, Urine: 1.015 (ref 1.005–1.030)
pH: 6 (ref 5.0–8.0)

## 2012-08-27 LAB — CBC WITH DIFFERENTIAL/PLATELET
Eosinophils Absolute: 0 10*3/uL (ref 0.0–0.7)
Eosinophils Relative: 1 % (ref 0–5)
HCT: 38.6 % (ref 36.0–46.0)
Hemoglobin: 13.5 g/dL (ref 12.0–15.0)
Lymphocytes Relative: 12 % (ref 12–46)
Lymphs Abs: 0.6 10*3/uL — ABNORMAL LOW (ref 0.7–4.0)
MCH: 29.5 pg (ref 26.0–34.0)
MCV: 84.3 fL (ref 78.0–100.0)
Monocytes Relative: 6 % (ref 3–12)
RBC: 4.58 MIL/uL (ref 3.87–5.11)
WBC: 4.5 10*3/uL (ref 4.0–10.5)

## 2012-08-27 LAB — URINE MICROSCOPIC-ADD ON

## 2012-08-27 LAB — LIPASE, BLOOD: Lipase: 207 U/L — ABNORMAL HIGH (ref 11–59)

## 2012-08-27 MED ORDER — ONDANSETRON HCL 4 MG PO TABS: 4.0000 mg | ORAL_TABLET | Freq: Four times a day (QID) | ORAL | Status: DC

## 2012-08-27 MED ORDER — SODIUM CHLORIDE 0.9 % IV SOLN
INTRAVENOUS | Status: DC
  Administered 2012-08-27: 09:00:00 via INTRAVENOUS

## 2012-08-27 MED ORDER — CEFDINIR 300 MG PO CAPS: 300.0000 mg | ORAL_CAPSULE | Freq: Two times a day (BID) | ORAL | Status: DC

## 2012-08-27 MED ORDER — ONDANSETRON HCL 4 MG/2ML IJ SOLN
4.0000 mg | Freq: Once | INTRAMUSCULAR | Status: AC
  Administered 2012-08-27: 4 mg via INTRAVENOUS
  Filled 2012-08-27: qty 2

## 2012-08-27 MED ORDER — DEXTROSE 5 % IV SOLN
1.0000 g | Freq: Once | INTRAVENOUS | Status: AC
  Administered 2012-08-27: 1 g via INTRAVENOUS
  Filled 2012-08-27: qty 10

## 2012-08-27 NOTE — ED Notes (Signed)
Pt requesting something to eat. Family bringing food in.  nad noted.
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
## 2012-08-27 NOTE — ED Notes (Signed)
Pt c/o n/v since early this am. Denies diarrhea. Denies pain. Nad noted.

## 2012-08-29 ENCOUNTER — Telehealth: Payer: Self-pay | Admitting: Internal Medicine

## 2012-08-29 LAB — URINE CULTURE

## 2012-08-29 NOTE — Telephone Encounter (Signed)
PT called and stated that she was seen in the ER on 6/4, and was instructed to follow-up with her PCP the next day. PT stated that she didn't schedule anything at that time because she couldn't find a ride. She is now requesting the soonest appt, please assist in helping me find a time and day. Thank you!

## 2012-08-30 ENCOUNTER — Telehealth (HOSPITAL_COMMUNITY): Payer: Self-pay | Admitting: Emergency Medicine

## 2012-08-30 NOTE — ED Notes (Signed)
Post ED Visit - Positive Culture Follow-up  Culture report reviewed by antimicrobial stewardship pharmacist: [x]  Wes Dulaney, Pharm.D., BCPS []  Celedonio Miyamoto, Pharm.D., BCPS []  Georgina Pillion, Pharm.D., BCPS []  [place], [place].D., BCPS, AAHIVP []  Estella Husk, Pharm.D., BCPS, AAHIVP  Positive urine culture Treated with Cefdinir, organism sensitive to the same and no further patient follow-up is required at this time.  Kylie A Holland 08/30/2012, 4:22 PM

## 2012-08-31 NOTE — Telephone Encounter (Signed)
I was out of town last week  when this message was made.   Please call her ASSESS and record  If her clinical status .  She is under rx for a uti and should remain on the medication given if doing better .  Until I see her.   30 min OV on Friday 13 ( before she runs out of medication ) .  Thanks

## 2012-09-01 NOTE — Telephone Encounter (Signed)
Tried reaching the pt by telephone.  No voicemail to leave a message.  Will try again at a later time.

## 2012-09-02 NOTE — Telephone Encounter (Signed)
Called and received a busy tone.  Will try again at a later time.

## 2012-09-05 ENCOUNTER — Encounter: Payer: Self-pay | Admitting: Internal Medicine

## 2012-09-05 ENCOUNTER — Ambulatory Visit (INDEPENDENT_AMBULATORY_CARE_PROVIDER_SITE_OTHER): Payer: Medicare Other | Admitting: Internal Medicine

## 2012-09-05 VITALS — BP 120/80 | HR 84 | Temp 97.9°F | Wt 160.0 lb

## 2012-09-05 DIAGNOSIS — E1121 Type 2 diabetes mellitus with diabetic nephropathy: Secondary | ICD-10-CM

## 2012-09-05 DIAGNOSIS — N390 Urinary tract infection, site not specified: Secondary | ICD-10-CM | POA: Insufficient documentation

## 2012-09-05 DIAGNOSIS — N186 End stage renal disease: Secondary | ICD-10-CM

## 2012-09-05 DIAGNOSIS — N058 Unspecified nephritic syndrome with other morphologic changes: Secondary | ICD-10-CM

## 2012-09-05 DIAGNOSIS — R748 Abnormal levels of other serum enzymes: Secondary | ICD-10-CM | POA: Insufficient documentation

## 2012-09-05 DIAGNOSIS — E1129 Type 2 diabetes mellitus with other diabetic kidney complication: Secondary | ICD-10-CM

## 2012-09-05 LAB — POCT URINALYSIS DIPSTICK
Glucose, UA: NEGATIVE
Nitrite, UA: NEGATIVE
Urobilinogen, UA: 0.2

## 2012-09-05 MED ORDER — CEPHALEXIN 500 MG PO TABS: 500.0000 mg | ORAL_TABLET | Freq: Three times a day (TID) | ORAL | Status: DC

## 2012-09-05 NOTE — Addendum Note (Signed)
Addended by: Raj Janus T on: 09/05/2012 02:11 PM   Modules accepted: Orders

## 2012-09-05 NOTE — Patient Instructions (Addendum)
I suspect that your vomiting and nausea was from a urinary infection.  You had a mildly elevated blood test called a lipase. However I don't see any evidence of a pancreas problem. The blood test is probably abnormal because you have renal failure.  At this time would not repeat the test because you don't have abdominal pain and you were doing much better.  The urine test is improved but not totally normal. I am going to advise 5 more days it is different medicine to be safe and avoid relapse. Over the weekend.  If you're getting recurrent problems then check back with [place] or we can have you see the urologist.
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
## 2012-09-05 NOTE — Telephone Encounter (Signed)
Patient seen in the office on 09/05/12

## 2012-09-07 LAB — URINE CULTURE: Colony Count: 75000

## 2012-09-15 NOTE — Progress Notes (Signed)
Quick Note:  Tell pt urine culture showed multiple bacteria but not predominant germ. This probably is not important to her health and if doing ok then no further antibiotic needed at this time ______

## 2012-10-02 ENCOUNTER — Other Ambulatory Visit: Payer: Self-pay

## 2012-10-08 ENCOUNTER — Ambulatory Visit (INDEPENDENT_AMBULATORY_CARE_PROVIDER_SITE_OTHER): Payer: Medicare Other | Admitting: Cardiovascular Disease

## 2012-10-08 ENCOUNTER — Encounter: Payer: Self-pay | Admitting: Cardiovascular Disease

## 2012-10-08 VITALS — BP 120/85 | HR 79 | Ht 65.5 in | Wt 164.8 lb

## 2012-10-08 DIAGNOSIS — I10 Essential (primary) hypertension: Secondary | ICD-10-CM

## 2012-10-08 NOTE — Assessment & Plan Note (Addendum)
Ms. Persad is feeling well.  Her initial BP was very elevated but  Decreased after a few minute  Wait.  She still eats too much salt.  I've given her information about the DASH diet.  Her last creatinine is 3.7 and she does not want to go on dialysis.  Will leave that decision up to her and Dr. Kathrene Bongo.
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
## 2012-10-08 NOTE — Patient Instructions (Addendum)
Your physician wants you to follow-up in: 6 MONTHS  You will receive a reminder letter in the mail two months in advance. If you don't receive a letter, please call our office to schedule the follow-up appointment.  Your physician recommends that you continue on your current medications as directed. Please refer to the Current Medication list given to you today.  REDUCE HIGH SODIUM FOODS LIKE CANNED SOUP, GRAVY, SAUCES, READY PREPARED FOODS LIKE FROZEN FOODS; LEAN CUISINE, LASAGNA. BACON, SAUSAGE, LUNCH MEAT, FAST FOODS, HOT DOGS, CHIPS, PIZZA.   DASH Diet The DASH diet stands for "Dietary Approaches to Stop Hypertension." It is a healthy eating plan that has been shown to reduce high blood pressure (hypertension) in as little as 14 days, while also possibly providing other significant health benefits. These other health benefits include reducing the risk of breast cancer after menopause and reducing the risk of type 2 diabetes, heart disease, colon cancer, and stroke. Health benefits also include weight loss and slowing kidney failure in patients with chronic kidney disease.  DIET GUIDELINES  Limit salt (sodium). Your diet should contain less than 1500 mg of sodium daily.  Limit refined or processed carbohydrates. Your diet should include mostly whole grains. Desserts and added sugars should be used sparingly.  Include small amounts of heart-healthy fats. These types of fats include nuts, oils, and tub margarine. Limit saturated and trans fats. These fats have been shown to be harmful in the body. CHOOSING FOODS  The following food groups are based on a 2000 calorie diet. See your Registered Dietitian for individual calorie needs. Grains and Grain Products (6 to 8 servings daily)  Eat More Often: Whole-wheat bread, brown rice, whole-grain or wheat pasta, quinoa, popcorn without added fat or salt (air popped).  Eat Less Often: White bread, white pasta, white rice, cornbread. Vegetables (4 to 5  servings daily)  Eat More Often: Fresh, frozen, and canned vegetables. Vegetables may be raw, steamed, roasted, or grilled with a minimal amount of fat.  Eat Less Often/Avoid: Creamed or fried vegetables. Vegetables in a cheese sauce. Fruit (4 to 5 servings daily)  Eat More Often: All fresh, canned (in natural juice), or frozen fruits. Dried fruits without added sugar. One hundred percent fruit juice ( cup [237 mL] daily).  Eat Less Often: Dried fruits with added sugar. Canned fruit in light or heavy syrup. [employer], Fish, and Poultry (2 servings or less daily. One serving is 3 to 4 oz [85-114 g]).  Eat More Often: Ninety percent or leaner ground beef, tenderloin, sirloin. Round cuts of beef, chicken breast, [place] breast. All fish. Grill, bake, or broil your meat. Nothing should be fried.  Eat Less Often/Avoid: Fatty cuts of meat, [place], or chicken leg, thigh, or wing. Fried cuts of meat or fish. Dairy (2 to 3 servings)  Eat More Often: Low-fat or fat-free milk, low-fat plain or light yogurt, reduced-fat or part-skim cheese.  Eat Less Often/Avoid: Milk (whole, 2%).Whole milk yogurt. Full-fat cheeses. Nuts, Seeds, and Legumes (4 to 5 servings per week)  Eat More Often: All without added salt.  Eat Less Often/Avoid: Salted nuts and seeds, canned beans with added salt. Fats and Sweets (limited)  Eat More Often: Vegetable oils, tub margarines without trans fats, sugar-free gelatin. Mayonnaise and salad dressings.  Eat Less Often/Avoid: Coconut oils, palm oils, butter, stick margarine, cream, half and half, cookies, candy, pie. FOR MORE INFORMATION The Dash Diet Eating Plan: www.dashdiet.org Document Released: 03/01/2011 Document Revised: 06/04/2011 Document Reviewed: 03/01/2011 ExitCare  Patient Information 2014 [employer].

## 2012-10-10 ENCOUNTER — Other Ambulatory Visit: Payer: Self-pay | Admitting: Internal Medicine

## 2012-10-20 ENCOUNTER — Other Ambulatory Visit: Payer: Self-pay | Admitting: Internal Medicine

## 2012-11-20 ENCOUNTER — Telehealth: Payer: Self-pay | Admitting: Internal Medicine

## 2012-11-20 NOTE — Telephone Encounter (Signed)
Pharm would like you to call so that they can get verbal approval for diabetic testing supplies

## 2012-11-21 NOTE — Telephone Encounter (Signed)
Pt not using this company.

## 2012-11-28 ENCOUNTER — Observation Stay (HOSPITAL_COMMUNITY)
Admission: EM | Admit: 2012-11-28 | Discharge: 2012-11-29 | Disposition: A | Discharge status: Home / Self Care | Payer: Medicare Other | Attending: Family Medicine | Admitting: Family Medicine

## 2012-11-28 ENCOUNTER — Emergency Department (HOSPITAL_COMMUNITY): Payer: Medicare Other

## 2012-11-28 ENCOUNTER — Encounter (HOSPITAL_COMMUNITY): Payer: Self-pay | Admitting: *Deleted

## 2012-11-28 DIAGNOSIS — Z86718 Personal history of other venous thrombosis and embolism: Secondary | ICD-10-CM | POA: Insufficient documentation

## 2012-11-28 DIAGNOSIS — I120 Hypertensive chronic kidney disease with stage 5 chronic kidney disease or end stage renal disease: Secondary | ICD-10-CM | POA: Insufficient documentation

## 2012-11-28 DIAGNOSIS — R413 Other amnesia: Secondary | ICD-10-CM | POA: Diagnosis present

## 2012-11-28 DIAGNOSIS — N184 Chronic kidney disease, stage 4 (severe): Secondary | ICD-10-CM

## 2012-11-28 DIAGNOSIS — Z87891 Personal history of nicotine dependence: Secondary | ICD-10-CM | POA: Insufficient documentation

## 2012-11-28 DIAGNOSIS — E1121 Type 2 diabetes mellitus with diabetic nephropathy: Secondary | ICD-10-CM | POA: Diagnosis present

## 2012-11-28 DIAGNOSIS — R9431 Abnormal electrocardiogram [ECG] [EKG]: Secondary | ICD-10-CM

## 2012-11-28 DIAGNOSIS — Z950 Presence of cardiac pacemaker: Secondary | ICD-10-CM | POA: Insufficient documentation

## 2012-11-28 DIAGNOSIS — I951 Orthostatic hypotension: Principal | ICD-10-CM | POA: Insufficient documentation

## 2012-11-28 DIAGNOSIS — I714 Abdominal aortic aneurysm, without rupture, unspecified: Secondary | ICD-10-CM | POA: Insufficient documentation

## 2012-11-28 DIAGNOSIS — R55 Syncope and collapse: Secondary | ICD-10-CM

## 2012-11-28 DIAGNOSIS — E119 Type 2 diabetes mellitus without complications: Secondary | ICD-10-CM | POA: Insufficient documentation

## 2012-11-28 DIAGNOSIS — J4489 Other specified chronic obstructive pulmonary disease: Secondary | ICD-10-CM | POA: Insufficient documentation

## 2012-11-28 DIAGNOSIS — J449 Chronic obstructive pulmonary disease, unspecified: Secondary | ICD-10-CM | POA: Insufficient documentation

## 2012-11-28 DIAGNOSIS — N185 Chronic kidney disease, stage 5: Secondary | ICD-10-CM | POA: Insufficient documentation

## 2012-11-28 DIAGNOSIS — N179 Acute kidney failure, unspecified: Secondary | ICD-10-CM

## 2012-11-28 DIAGNOSIS — Z79899 Other long term (current) drug therapy: Secondary | ICD-10-CM | POA: Insufficient documentation

## 2012-11-28 DIAGNOSIS — E785 Hyperlipidemia, unspecified: Secondary | ICD-10-CM | POA: Insufficient documentation

## 2012-11-28 DIAGNOSIS — M1990 Unspecified osteoarthritis, unspecified site: Secondary | ICD-10-CM | POA: Insufficient documentation

## 2012-11-28 DIAGNOSIS — D649 Anemia, unspecified: Secondary | ICD-10-CM | POA: Insufficient documentation

## 2012-11-28 DIAGNOSIS — E049 Nontoxic goiter, unspecified: Secondary | ICD-10-CM | POA: Insufficient documentation

## 2012-11-28 DIAGNOSIS — E860 Dehydration: Secondary | ICD-10-CM | POA: Diagnosis present

## 2012-11-28 LAB — CBC WITH DIFFERENTIAL/PLATELET
Eosinophils Absolute: 0.1 10*3/uL (ref 0.0–0.7)
Eosinophils Relative: 3 % (ref 0–5)
Lymphs Abs: 0.7 10*3/uL (ref 0.7–4.0)
MCH: 28.3 pg (ref 26.0–34.0)
MCV: 85.3 fL (ref 78.0–100.0)
Platelets: 176 10*3/uL (ref 150–400)
RDW: 13 % (ref 11.5–15.5)

## 2012-11-28 LAB — PHOSPHORUS: Phosphorus: 4.4 mg/dL (ref 2.3–4.6)

## 2012-11-28 LAB — GLUCOSE, CAPILLARY

## 2012-11-28 LAB — COMPREHENSIVE METABOLIC PANEL
ALT: 10 U/L (ref 0–35)
Calcium: 9.9 mg/dL (ref 8.4–10.5)
GFR calc Af Amer: 10 mL/min — ABNORMAL LOW (ref >90)
Glucose, Bld: 217 mg/dL — ABNORMAL HIGH (ref 70–99)
Sodium: 137 mEq/L (ref 135–145)
Total Protein: 7.2 g/dL (ref 6.0–8.3)

## 2012-11-28 LAB — TROPONIN I: Troponin I: <0.3 ng/mL (ref <0.30)

## 2012-11-28 MED ORDER — AMLODIPINE BESYLATE 5 MG PO TABS
5.0000 mg | ORAL_TABLET | Freq: Every morning | ORAL | Status: DC
  Administered 2012-11-29: 5 mg via ORAL
  Filled 2012-11-28: qty 1

## 2012-11-28 MED ORDER — SIMVASTATIN 20 MG PO TABS: 20.0000 mg | ORAL_TABLET | Freq: Every day | ORAL | Status: DC

## 2012-11-28 MED ORDER — SODIUM CHLORIDE 0.9 % IV SOLN
INTRAVENOUS | Status: DC
  Administered 2012-11-29: 09:00:00 via INTRAVENOUS

## 2012-11-28 MED ORDER — ACETAMINOPHEN 325 MG PO TABS: 650.0000 mg | ORAL_TABLET | Freq: Four times a day (QID) | ORAL | Status: DC | PRN

## 2012-11-28 MED ORDER — ACETAMINOPHEN 650 MG RE SUPP: 650.0000 mg | Freq: Four times a day (QID) | RECTAL | Status: DC | PRN

## 2012-11-28 MED ORDER — ONDANSETRON HCL 4 MG/2ML IJ SOLN: 4.0000 mg | Freq: Four times a day (QID) | INTRAMUSCULAR | Status: DC | PRN

## 2012-11-28 MED ORDER — HEPARIN SODIUM (PORCINE) 5000 UNIT/ML IJ SOLN
5000.0000 [IU] | Freq: Three times a day (TID) | INTRAMUSCULAR | Status: DC
  Administered 2012-11-28 – 2012-11-29 (×3): 5000 [IU] via SUBCUTANEOUS
  Filled 2012-11-28 (×3): qty 1

## 2012-11-28 MED ORDER — CALCIUM CARBONATE-VITAMIN D 600-400 MG-UNIT PO TABS: 1.0000 | ORAL_TABLET | Freq: Two times a day (BID) | ORAL | Status: DC

## 2012-11-28 MED ORDER — CALCIUM CARBONATE-VITAMIN D 500-200 MG-UNIT PO TABS
1.0000 | ORAL_TABLET | Freq: Two times a day (BID) | ORAL | Status: DC
  Administered 2012-11-28 – 2012-11-29 (×2): 1 via ORAL
  Filled 2012-11-28 (×2): qty 1

## 2012-11-28 MED ORDER — ONDANSETRON HCL 4 MG PO TABS: 4.0000 mg | ORAL_TABLET | Freq: Four times a day (QID) | ORAL | Status: DC | PRN

## 2012-11-28 MED ORDER — POLYETHYLENE GLYCOL 3350 17 G PO PACK: 17.0000 g | PACK | Freq: Every day | ORAL | Status: DC | PRN

## 2012-11-28 MED ORDER — ALBUTEROL SULFATE HFA 108 (90 BASE) MCG/ACT IN AERS: 2.0000 | INHALATION_SPRAY | RESPIRATORY_TRACT | Status: DC | PRN

## 2012-11-28 MED ORDER — INSULIN ASPART 100 UNIT/ML ~~LOC~~ SOLN: 0.0000 [IU] | Freq: Three times a day (TID) | SUBCUTANEOUS | Status: DC

## 2012-11-28 MED ORDER — INSULIN ASPART 100 UNIT/ML ~~LOC~~ SOLN: 0.0000 [IU] | Freq: Every day | SUBCUTANEOUS | Status: DC

## 2012-11-28 MED ORDER — TRAZODONE HCL 50 MG PO TABS: 25.0000 mg | ORAL_TABLET | Freq: Every evening | ORAL | Status: DC | PRN

## 2012-11-28 MED ORDER — SODIUM CHLORIDE 0.9 % IV BOLUS (SEPSIS)
1000.0000 mL | Freq: Once | INTRAVENOUS | Status: AC
  Administered 2012-11-28: 1000 mL via INTRAVENOUS

## 2012-11-28 MED ORDER — ADULT MULTIVITAMIN W/MINERALS CH
1.0000 | ORAL_TABLET | Freq: Every morning | ORAL | Status: DC
  Administered 2012-11-29: 1 via ORAL
  Filled 2012-11-28: qty 1

## 2012-11-28 MED ORDER — SODIUM CHLORIDE 0.9 % IJ SOLN
3.0000 mL | Freq: Two times a day (BID) | INTRAMUSCULAR | Status: DC
  Administered 2012-11-28 – 2012-11-29 (×2): 3 mL via INTRAVENOUS

## 2012-11-28 MED ORDER — BISACODYL 10 MG RE SUPP: 10.0000 mg | Freq: Every day | RECTAL | Status: DC | PRN

## 2012-11-28 MED ORDER — ONDANSETRON HCL 4 MG/2ML IJ SOLN: 4.0000 mg | Freq: Three times a day (TID) | INTRAMUSCULAR | Status: DC | PRN

## 2012-11-28 MED ORDER — DONEPEZIL HCL 5 MG PO TABS
5.0000 mg | ORAL_TABLET | Freq: Every day | ORAL | Status: DC
  Administered 2012-11-28: 5 mg via ORAL
  Filled 2012-11-28: qty 1

## 2012-11-28 NOTE — ED Notes (Signed)
Pt stated that she was not dizzy from lying to setting or setting to standing but pt's bp did drop Dr. Manus Gunning aware. Wyman Meschke
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
## 2012-11-28 NOTE — ED Notes (Signed)
Pacemaker interrogated. 

## 2012-11-28 NOTE — ED Notes (Addendum)
With pt's permission, spoke with pt's daughter, [name], and updated on plan of care. [name] can be reached at [phone].

## 2012-11-28 NOTE — ED Notes (Signed)
Received phone call from [facility] with [employer]; he reports that pacemaker is functioning as programmed, and the battery is good.  He will fax report to ED.

## 2012-11-28 NOTE — ED Notes (Addendum)
Pt was walking outside  And ? Had syncopal episode.   EMS says cbg was 135  ,  Ems brought her to ER, says she felt weak and was in the hot sun.  Alert on arrival.talking  Pt had blood work  Done here earlier as an OP
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
## 2012-11-29 DIAGNOSIS — N189 Chronic kidney disease, unspecified: Secondary | ICD-10-CM

## 2012-11-29 DIAGNOSIS — N184 Chronic kidney disease, stage 4 (severe): Secondary | ICD-10-CM

## 2012-11-29 DIAGNOSIS — N179 Acute kidney failure, unspecified: Secondary | ICD-10-CM

## 2012-11-29 DIAGNOSIS — R55 Syncope and collapse: Secondary | ICD-10-CM

## 2012-11-29 LAB — CBC
MCH: 27.8 pg (ref 26.0–34.0)
Platelets: 171 10*3/uL (ref 150–400)
RBC: 3.78 MIL/uL — ABNORMAL LOW (ref 3.87–5.11)
RDW: 13 % (ref 11.5–15.5)
WBC: 4.4 10*3/uL (ref 4.0–10.5)

## 2012-11-29 LAB — BASIC METABOLIC PANEL
Calcium: 9.6 mg/dL (ref 8.4–10.5)
Creatinine, Ser: 3.79 mg/dL — ABNORMAL HIGH (ref 0.50–1.10)
GFR calc non Af Amer: 10 mL/min — ABNORMAL LOW (ref >90)
Glucose, Bld: 91 mg/dL (ref 70–99)
Sodium: 140 mEq/L (ref 135–145)

## 2012-11-29 LAB — GLUCOSE, CAPILLARY: Glucose-Capillary: 102 mg/dL — ABNORMAL HIGH (ref 70–99)

## 2012-11-29 NOTE — Progress Notes (Signed)
[facility] PROGRESS NOTE  NOLA BOTKINS ZOX:[id number] DOB: 08/11/1927 DOA: 11/28/2012 PCP: Lorretta Harp, MD  Assessment/Plan: 1. Possible syncope: Workup unremarkable. History most suggestive of dehydration, orthostatic hypotension. No further evaluation suggested. 2. Orthostatic hypotension: Resolved with IV fluids. Likely secondary to dehydration, heat. 3. Acute on chronic renal failure: Much improved today. Appears to be at baseline with stage V chronic kidney disease. 4. History of pacemaker placement: Pacemaker was interrogated and found to be functioning normally. 5. Diabetes mellitus: Well controlled.   Discharge home today  Pending studies:   TSH  Code Status: Full Family Communication: None present  Brendia Sacks, MD  [facility]  Pager [phone] If 7PM-7AM, please contact night-coverage at [email or web address], password [facility] 11/29/2012, 11:10 AM  LOS: 1 day   Summary: 79 year old woman who was chest restaurant at lunch and on the way home was found laying on the ground near the sidewalk. He is not clear what happened, the patient denied syncope stating she decided to lay down after feeling weak. By report bystander found her to be unresponsive. Workup in the emergency department suggested orthostasis, impression was acute on chronic renal failure, vasovagal syncope secondary to orthostasis, patient was admitted for hydration and observation.  Consultants:  none  Procedures:  None   Antibiotics:  none  HPI/Subjective: Feels fine. No complaints. No dizziness, lightheadedness, chest pain, shortness of breath. No weakness or neurologic symptoms. She near rates in great detail what happened yesterday which is congruent with that documented in the chart.  Objective: Filed Vitals:   11/29/12 0141 11/29/12 0630 11/29/12 0635 11/29/12 0640  BP: 128/72 118/81 116/76 106/70  Pulse: 76 68 70 75  Temp: 97.8 F (36.6 C) 97.9 F (36.6 C)    TempSrc: Oral  Oral    Resp: 19 17    Height:      Weight:      SpO2: 100% 96% 95% 93%   No intake or output data in the 24 hours ending 11/29/12 1110   Filed Weights   11/28/12 1534  Weight: 72.576 kg (160 lb)    Exam:   Afebrile. Orthostatics negative this morning. Vital stable.  General: Appears calm and comfortable.  Cardiovascular: Regular rate and rhythm. No murmur, rub, gallop. No lower extremity edema.  Telemetry: Sinus rhythm, no arrhythmias.  Respiratory: Clear to auscultation bilaterally. No wheezes, rales, rhonchi. Normal respiratory effort.  Psychiatric: Grossly normal mood and affect. Speech fluent and appropriate.  Neurologic: Grossly unremarkable.  Data Reviewed:  BUN and creatinine improved. hepatic function panel is unremarkable, troponin negative  CBC appears unremarkable. Mild anemia.  CT of the head: no acute abnormalities   EKG: Normal sinus rhythm. Left ventricular hypertrophy with repolarization abnormality, ST depression laterally. Compared to previous study 08/18/2011 no significant changes are seen. LVH with repolarization abnormality and ST changes demonstrated.   Scheduled Meds: . sodium chloride   Intravenous STAT  . amLODipine  5 mg Oral q morning - 10a  . calcium-vitamin D  1 tablet Oral BID  . donepezil  5 mg Oral QHS  . heparin  5,000 Units Subcutaneous Q8H  . insulin aspart  0-5 Units Subcutaneous QHS  . insulin aspart  0-9 Units Subcutaneous TID WC  . multivitamin with minerals  1 tablet Oral q morning - 10a  . simvastatin  20 mg Oral q1800  . 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

## 2012-11-29 NOTE — Discharge Summary (Signed)
Physician Discharge Summary  [name] ZOX:[id number] DOB: 1928-02-13 DOA: 11/28/2012  PCP: Melissa Harp, MD  Admit date: 11/28/2012 Discharge date: 11/29/2012  Recommendations for Outpatient Follow-up:  1. Chronic kidney disease  2. Mild normocytic anemia without evidence of bleeding.  Follow-up Information   Follow up with Melissa Harp, MD In 1 week.   Specialty:  Internal Medicine   Contact information:   [address] Christena Flake [place] [address] [phone]      Discharge Diagnoses:  1. Possible syncope 2. Orthostatic hypotension 3. Acute on chronic renal failure 4. History pacemaker placement 5. Diabetes mellitus  Discharge Condition: Improved Disposition: Home  Diet recommendation: Diabetic diet  Filed Weights   11/28/12 1534  Weight: 72.576 kg (160 lb)    History of present illness:  79 year old woman was found laying on the ground near the sidewalk on her way home after visiting a restaurant for lunch. It is not clear what happened, the patient denied syncope stating she decided to lay down after feeling weak. By report bystander found her to be unresponsive. Workup in the emergency department suggested orthostasis, impression was acute on chronic renal failure, vasovagal syncope secondary to orthostasis, patient was admitted for hydration and observation.  Hospital Course:  [name] was admitted for hydration and observation. After IV hydration her kidney function has returned to baseline and orthostasis has resolved. She has no focal neurologic signs or symptoms and is now stable for discharge. She has an excellent history today congruent with that documented in the chart. She had lab work done yesterday and therefore had not eaten any breakfast and had not drank very much. She went to the restaurant adjacent to the after her blood work and was walking home when she felt lightheaded, weak and decided to sit down. There is no place to sit down as  she was in the sidewalk and so she simply sat down. She then felt tired and laid down. Her history is most suggestive of vasovagal syncope or presyncope, fatigue and possible heat exhaustion. Workup in the hospital was unremarkable and now she is stable for discharge. Nephrology consultation was deferred given her clinical improvement.  1. Possible syncope: Workup unremarkable. History most suggestive of dehydration, orthostatic hypotension. No further evaluation suggested. 2. Orthostatic hypotension: Resolved with IV fluids. Likely secondary to dehydration, heat. 3. Acute on chronic renal failure: Much improved today. Appears to be at baseline with stage IV-V chronic kidney disease. 4. History of pacemaker placement: Pacemaker was interrogated and found to be functioning normally. 5. Diabetes mellitus: Well controlled.  Consultants:  none Procedures:  None  Antibiotics:  none  Discharge Instructions  Discharge Orders   Future Appointments Provider Department Dept Phone   12/01/2012 1:45 PM Madelin Headings, MD [facility] [phone]   03/06/2013 11:00 AM [facility] [place] [phone]   Future Orders Complete By Expires   Activity as tolerated - No restrictions  As directed    Diet Carb Modified  As directed    Discharge instructions  As directed    Comments:     Call physician or seek immediate medical assistance for dizziness, lightheadedness, passing out or worsening of condition.       Medication List         acetaminophen 500 MG tablet  Commonly known as:  TYLENOL  Take 500 mg by mouth every 6 (six) hours as needed for pain.     albuterol 108 (90 BASE) MCG/ACT inhaler  Commonly known as:  PROVENTIL HFA;VENTOLIN HFA  Inhale 2 puffs into the lungs every 4 (four) hours as needed for wheezing or shortness of breath (DIspense with spacer).     amLODipine 5 MG tablet  Commonly known as:  NORVASC  Take 5 mg by mouth  every morning.     CALCIUM 600+D 600-400 MG-UNIT per tablet  Generic drug:  Calcium Carbonate-Vitamin D  Take 1 tablet by mouth 2 (two) times daily.     donepezil 5 MG tablet  Commonly known as:  ARICEPT  Take 5 mg by mouth at bedtime.     furosemide 40 MG tablet  Commonly known as:  LASIX  Take 40 mg by mouth 2 (two) times daily.     multivitamin with minerals Tabs tablet  Take 1 tablet by mouth every morning.     simvastatin 20 MG tablet  Commonly known as:  ZOCOR  Take 20 mg by mouth every morning.       No Known Allergies  The results of significant diagnostics from this hospitalization (including imaging, microbiology, ancillary and laboratory) are listed below for reference.    Significant Diagnostic Studies: Ct Head Wo Contrast  11/28/2012   *RADIOLOGY REPORT*  Clinical Data: Near-syncope today while walking, weakness, history diabetes, hypertension, subdural hematoma, abdominal aortic aneurysm, COPD  CT HEAD WITHOUT CONTRAST  Technique:  Contiguous axial images were obtained from the base of the skull through the vertex without contrast.  Comparison: 09/06/2011  Findings: Streak artifacts at skull base. Mild generalized atrophy. No midline shift or mass effect. Small vessel chronic ischemic changes of deep cerebral white matter. No intracranial hemorrhage, mass lesion or evidence of acute infarction. No extra-axial fluid collections. Bones and sinuses unremarkable.  IMPRESSION: Atrophy with small vessel chronic ischemic changes of deep cerebral white matter. No acute intracranial abnormalities.   Original Report Authenticated By: Ulyses Southward, M.D.    Labs: Basic Metabolic Panel:  Recent Labs Lab 11/28/12 1606 11/29/12 0522  NA 137 140  K 3.9 3.9  CL 99 104  CO2 25 27  GLUCOSE 217* 91  BUN 53* 49*  CREATININE 4.26* 3.79*  CALCIUM 9.9 9.6  PHOS 4.4  --    Liver Function Tests:  Recent Labs Lab 11/28/12 1606  AST 30  ALT 10  ALKPHOS 83  BILITOT 0.3  PROT  7.2  ALBUMIN 3.6   CBC:  Recent Labs Lab 11/28/12 1606 11/29/12 0522  WBC 4.4 4.4  NEUTROABS 3.2  --   HGB 12.1 10.5*  HCT 36.5 32.3*  MCV 85.3 85.4  PLT 176 171   Cardiac Enzymes:  Recent Labs Lab 11/28/12 1606  TROPONINI <0.30  CBG:  Recent Labs Lab 11/28/12 2334  GLUCAP 123*    Active Problems:   Memory difficulty   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   Renal failure (ARF), acute on chronic   Syncope   EKG abnormality   Dehydration   Time coordinating discharge: 25 minutes  Signed:  Brendia Sacks, MD [facility] 11/29/2012, 12:34 PM

## 2012-11-29 NOTE — Discharge Summary (Signed)
Pt stated she was ready to go home and she had no pain.  Pt's IV and tele were removed and she was informed about FU appointment.  Pt was wheeled to car by PCT and family.

## 2012-12-01 ENCOUNTER — Ambulatory Visit (INDEPENDENT_AMBULATORY_CARE_PROVIDER_SITE_OTHER): Payer: Medicare Other | Admitting: Internal Medicine

## 2012-12-01 ENCOUNTER — Encounter: Payer: Self-pay | Admitting: Internal Medicine

## 2012-12-01 VITALS — BP 120/80 | HR 79 | Temp 97.8°F | Wt 162.0 lb

## 2012-12-01 DIAGNOSIS — N058 Unspecified nephritic syndrome with other morphologic changes: Secondary | ICD-10-CM

## 2012-12-01 DIAGNOSIS — N186 End stage renal disease: Secondary | ICD-10-CM

## 2012-12-01 DIAGNOSIS — R4189 Other symptoms and signs involving cognitive functions and awareness: Secondary | ICD-10-CM

## 2012-12-01 DIAGNOSIS — M6530 Trigger finger, unspecified finger: Secondary | ICD-10-CM

## 2012-12-01 DIAGNOSIS — E1129 Type 2 diabetes mellitus with other diabetic kidney complication: Secondary | ICD-10-CM

## 2012-12-01 DIAGNOSIS — E1121 Type 2 diabetes mellitus with diabetic nephropathy: Secondary | ICD-10-CM

## 2012-12-01 DIAGNOSIS — N184 Chronic kidney disease, stage 4 (severe): Secondary | ICD-10-CM

## 2012-12-01 DIAGNOSIS — Z23 Encounter for immunization: Secondary | ICD-10-CM

## 2012-12-01 NOTE — Patient Instructions (Addendum)
Your  Blood pressure is good today . Agree no  Need for frequent diabetic supplies.   Watching what you eat . Keep your appt .  With dr Doylene [place].  If trigger fingers getting worse we can have a hand specialist ss your hands .   Recheck OV in 3 months or as needed
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
## 2013-02-04 LAB — HM DIABETES EYE EXAM: HM Diabetic Eye Exam: NOT DETECTED

## 2013-02-09 ENCOUNTER — Encounter: Payer: Self-pay | Admitting: Internal Medicine

## 2013-02-25 ENCOUNTER — Other Ambulatory Visit: Payer: Self-pay | Admitting: Internal Medicine

## 2013-03-02 ENCOUNTER — Encounter: Payer: Self-pay | Admitting: Internal Medicine

## 2013-03-02 ENCOUNTER — Ambulatory Visit (INDEPENDENT_AMBULATORY_CARE_PROVIDER_SITE_OTHER): Payer: Medicare Other | Admitting: Internal Medicine

## 2013-03-02 VITALS — BP 134/64 | Temp 97.8°F | Wt 158.0 lb

## 2013-03-02 DIAGNOSIS — N184 Chronic kidney disease, stage 4 (severe): Secondary | ICD-10-CM

## 2013-03-02 DIAGNOSIS — F0390 Unspecified dementia without behavioral disturbance: Secondary | ICD-10-CM | POA: Insufficient documentation

## 2013-03-02 DIAGNOSIS — H579 Unspecified disorder of eye and adnexa: Secondary | ICD-10-CM

## 2013-03-02 DIAGNOSIS — E785 Hyperlipidemia, unspecified: Secondary | ICD-10-CM

## 2013-03-02 DIAGNOSIS — I10 Essential (primary) hypertension: Secondary | ICD-10-CM

## 2013-03-02 DIAGNOSIS — R9389 Abnormal findings on diagnostic imaging of other specified body structures: Secondary | ICD-10-CM

## 2013-03-02 DIAGNOSIS — M6530 Trigger finger, unspecified finger: Secondary | ICD-10-CM

## 2013-03-02 DIAGNOSIS — R413 Other amnesia: Secondary | ICD-10-CM

## 2013-03-02 DIAGNOSIS — Z950 Presence of cardiac pacemaker: Secondary | ICD-10-CM

## 2013-03-02 MED ORDER — DONEPEZIL HCL 10 MG PO TABS: 10.0000 mg | ORAL_TABLET | Freq: Every day | ORAL | Status: DC
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
## 2013-03-02 NOTE — Patient Instructions (Addendum)
Increase aricept to 10 mg per day (  2  -5 mg tablets  Or 1 10 mg tablet   )    return office visit in 4 months or as needed   Wt Readings from Last 3 Encounters:  03/02/13 158 lb (71.668 kg)  12/01/12 162 lb (73.483 kg)  11/28/12 160 lb (72.576 kg)

## 2013-03-06 ENCOUNTER — Ambulatory Visit (INDEPENDENT_AMBULATORY_CARE_PROVIDER_SITE_OTHER): Payer: Medicare Other | Admitting: *Deleted

## 2013-03-06 DIAGNOSIS — I498 Other specified cardiac arrhythmias: Secondary | ICD-10-CM

## 2013-03-06 NOTE — Progress Notes (Signed)
Pacer check in office. 

## 2013-03-09 LAB — MDC_IDC_ENUM_SESS_TYPE_INCLINIC
Battery Remaining Longevity: 6 mo
Battery Voltage: 2.63 V
Brady Statistic AS VP Percent: 0 %
Lead Channel Pacing Threshold Amplitude: 0.75 V
Lead Channel Pacing Threshold Amplitude: 1 V
Lead Channel Sensing Intrinsic Amplitude: 4 mV
Lead Channel Setting Pacing Amplitude: 2 V
Lead Channel Setting Pacing Amplitude: 2.5 V
Lead Channel Setting Pacing Pulse Width: 0.4 ms
Lead Channel Setting Sensing Sensitivity: 5.6 mV

## 2013-03-10 ENCOUNTER — Emergency Department (HOSPITAL_COMMUNITY)
Admission: EM | Admit: 2013-03-10 | Discharge: 2013-03-10 | Disposition: A | Discharge status: Home / Self Care | Payer: Medicare Other | Attending: Emergency Medicine | Admitting: Emergency Medicine

## 2013-03-10 ENCOUNTER — Encounter (HOSPITAL_COMMUNITY): Payer: Self-pay | Admitting: Emergency Medicine

## 2013-03-10 DIAGNOSIS — E119 Type 2 diabetes mellitus without complications: Secondary | ICD-10-CM | POA: Insufficient documentation

## 2013-03-10 DIAGNOSIS — R55 Syncope and collapse: Secondary | ICD-10-CM | POA: Insufficient documentation

## 2013-03-10 DIAGNOSIS — N184 Chronic kidney disease, stage 4 (severe): Secondary | ICD-10-CM

## 2013-03-10 DIAGNOSIS — Z950 Presence of cardiac pacemaker: Secondary | ICD-10-CM | POA: Insufficient documentation

## 2013-03-10 DIAGNOSIS — Z9089 Acquired absence of other organs: Secondary | ICD-10-CM | POA: Insufficient documentation

## 2013-03-10 DIAGNOSIS — E785 Hyperlipidemia, unspecified: Secondary | ICD-10-CM | POA: Insufficient documentation

## 2013-03-10 DIAGNOSIS — Z87828 Personal history of other (healed) physical injury and trauma: Secondary | ICD-10-CM | POA: Insufficient documentation

## 2013-03-10 DIAGNOSIS — Z86718 Personal history of other venous thrombosis and embolism: Secondary | ICD-10-CM | POA: Insufficient documentation

## 2013-03-10 DIAGNOSIS — Z87891 Personal history of nicotine dependence: Secondary | ICD-10-CM | POA: Insufficient documentation

## 2013-03-10 DIAGNOSIS — R011 Cardiac murmur, unspecified: Secondary | ICD-10-CM | POA: Insufficient documentation

## 2013-03-10 DIAGNOSIS — Z862 Personal history of diseases of the blood and blood-forming organs and certain disorders involving the immune mechanism: Secondary | ICD-10-CM | POA: Insufficient documentation

## 2013-03-10 DIAGNOSIS — Z8619 Personal history of other infectious and parasitic diseases: Secondary | ICD-10-CM | POA: Insufficient documentation

## 2013-03-10 DIAGNOSIS — J4489 Other specified chronic obstructive pulmonary disease: Secondary | ICD-10-CM | POA: Insufficient documentation

## 2013-03-10 DIAGNOSIS — Z8739 Personal history of other diseases of the musculoskeletal system and connective tissue: Secondary | ICD-10-CM | POA: Insufficient documentation

## 2013-03-10 DIAGNOSIS — E669 Obesity, unspecified: Secondary | ICD-10-CM | POA: Insufficient documentation

## 2013-03-10 DIAGNOSIS — Z79899 Other long term (current) drug therapy: Secondary | ICD-10-CM | POA: Insufficient documentation

## 2013-03-10 DIAGNOSIS — Z87440 Personal history of urinary (tract) infections: Secondary | ICD-10-CM | POA: Insufficient documentation

## 2013-03-10 DIAGNOSIS — I129 Hypertensive chronic kidney disease with stage 1 through stage 4 chronic kidney disease, or unspecified chronic kidney disease: Secondary | ICD-10-CM | POA: Insufficient documentation

## 2013-03-10 DIAGNOSIS — N390 Urinary tract infection, site not specified: Secondary | ICD-10-CM

## 2013-03-10 DIAGNOSIS — F29 Unspecified psychosis not due to a substance or known physiological condition: Secondary | ICD-10-CM | POA: Insufficient documentation

## 2013-03-10 DIAGNOSIS — J449 Chronic obstructive pulmonary disease, unspecified: Secondary | ICD-10-CM | POA: Insufficient documentation

## 2013-03-10 LAB — BASIC METABOLIC PANEL
BUN: 52 mg/dL — ABNORMAL HIGH (ref 6–23)
CO2: 29 mEq/L (ref 19–32)
Calcium: 10.5 mg/dL (ref 8.4–10.5)
Chloride: 100 mEq/L (ref 96–112)
Creatinine, Ser: 4 mg/dL — ABNORMAL HIGH (ref 0.50–1.10)
Glucose, Bld: 84 mg/dL (ref 70–99)

## 2013-03-10 LAB — CBC WITH DIFFERENTIAL/PLATELET
Basophils Absolute: 0 10*3/uL (ref 0.0–0.1)
Eosinophils Absolute: 0 10*3/uL (ref 0.0–0.7)
Eosinophils Relative: 1 % (ref 0–5)
HCT: 39 % (ref 36.0–46.0)
Hemoglobin: 12.9 g/dL (ref 12.0–15.0)
Lymphocytes Relative: 16 % (ref 12–46)
Lymphs Abs: 0.7 10*3/uL (ref 0.7–4.0)
MCH: 28.1 pg (ref 26.0–34.0)
MCV: 85 fL (ref 78.0–100.0)
Monocytes Absolute: 0.3 10*3/uL (ref 0.1–1.0)
Monocytes Relative: 7 % (ref 3–12)
Neutro Abs: 3.2 10*3/uL (ref 1.7–7.7)
RDW: 14.3 % (ref 11.5–15.5)
WBC: 4.2 10*3/uL (ref 4.0–10.5)

## 2013-03-10 LAB — URINE MICROSCOPIC-ADD ON

## 2013-03-10 LAB — URINALYSIS, ROUTINE W REFLEX MICROSCOPIC
Bilirubin Urine: NEGATIVE
Glucose, UA: NEGATIVE mg/dL
Nitrite: NEGATIVE
Protein, ur: 30 mg/dL — AB
Urobilinogen, UA: 0.2 mg/dL (ref 0.0–1.0)

## 2013-03-10 LAB — TROPONIN I: Troponin I: <0.3 ng/mL (ref <0.30)

## 2013-03-10 MED ORDER — SULFAMETHOXAZOLE-TRIMETHOPRIM 800-160 MG PO TABS: 1.0000 | ORAL_TABLET | Freq: Two times a day (BID) | ORAL | Status: DC
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
## 2013-03-10 NOTE — ED Notes (Signed)
Per ems, pt had syncopal episode at the post office this a.m.  ems reports that the pt slid down the counter and sat herself down.  ems reports the pt was confused and had soiled her clothing.  Pt reports that the same thing happened to her 2 months prior to today and was seen here.  Pt is alert upon arrival.

## 2013-03-10 NOTE — ED Notes (Signed)
Discharge instructions reviewed with pt, questions answered. Pt verbalized understanding.  

## 2013-03-10 NOTE — ED Notes (Signed)
ems reports CBG of 86 upon their arrival.

## 2013-03-10 NOTE — ED Notes (Signed)
EDP wants Pacer evaluated.

## 2013-03-18 ENCOUNTER — Emergency Department (HOSPITAL_COMMUNITY): Payer: Medicare Other

## 2013-03-18 ENCOUNTER — Emergency Department (HOSPITAL_COMMUNITY)
Admission: EM | Admit: 2013-03-18 | Discharge: 2013-03-18 | Disposition: A | Discharge status: Home / Self Care | Payer: Medicare Other | Attending: Emergency Medicine | Admitting: Emergency Medicine

## 2013-03-18 ENCOUNTER — Encounter (HOSPITAL_COMMUNITY): Payer: Self-pay | Admitting: Emergency Medicine

## 2013-03-18 DIAGNOSIS — Z043 Encounter for examination and observation following other accident: Secondary | ICD-10-CM | POA: Insufficient documentation

## 2013-03-18 DIAGNOSIS — M1990 Unspecified osteoarthritis, unspecified site: Secondary | ICD-10-CM | POA: Insufficient documentation

## 2013-03-18 DIAGNOSIS — I498 Other specified cardiac arrhythmias: Secondary | ICD-10-CM | POA: Insufficient documentation

## 2013-03-18 DIAGNOSIS — Z87891 Personal history of nicotine dependence: Secondary | ICD-10-CM | POA: Insufficient documentation

## 2013-03-18 DIAGNOSIS — E785 Hyperlipidemia, unspecified: Secondary | ICD-10-CM | POA: Insufficient documentation

## 2013-03-18 DIAGNOSIS — Z87820 Personal history of traumatic brain injury: Secondary | ICD-10-CM | POA: Insufficient documentation

## 2013-03-18 DIAGNOSIS — J4489 Other specified chronic obstructive pulmonary disease: Secondary | ICD-10-CM | POA: Insufficient documentation

## 2013-03-18 DIAGNOSIS — Z792 Long term (current) use of antibiotics: Secondary | ICD-10-CM | POA: Insufficient documentation

## 2013-03-18 DIAGNOSIS — I10 Essential (primary) hypertension: Secondary | ICD-10-CM | POA: Insufficient documentation

## 2013-03-18 DIAGNOSIS — Z86718 Personal history of other venous thrombosis and embolism: Secondary | ICD-10-CM | POA: Insufficient documentation

## 2013-03-18 DIAGNOSIS — Z87828 Personal history of other (healed) physical injury and trauma: Secondary | ICD-10-CM | POA: Insufficient documentation

## 2013-03-18 DIAGNOSIS — J449 Chronic obstructive pulmonary disease, unspecified: Secondary | ICD-10-CM | POA: Insufficient documentation

## 2013-03-18 DIAGNOSIS — N390 Urinary tract infection, site not specified: Secondary | ICD-10-CM | POA: Insufficient documentation

## 2013-03-18 DIAGNOSIS — Z950 Presence of cardiac pacemaker: Secondary | ICD-10-CM | POA: Insufficient documentation

## 2013-03-18 DIAGNOSIS — F29 Unspecified psychosis not due to a substance or known physiological condition: Secondary | ICD-10-CM | POA: Insufficient documentation

## 2013-03-18 DIAGNOSIS — R55 Syncope and collapse: Secondary | ICD-10-CM | POA: Insufficient documentation

## 2013-03-18 DIAGNOSIS — R296 Repeated falls: Secondary | ICD-10-CM | POA: Insufficient documentation

## 2013-03-18 DIAGNOSIS — N289 Disorder of kidney and ureter, unspecified: Secondary | ICD-10-CM

## 2013-03-18 DIAGNOSIS — Y939 Activity, unspecified: Secondary | ICD-10-CM | POA: Insufficient documentation

## 2013-03-18 DIAGNOSIS — Y929 Unspecified place or not applicable: Secondary | ICD-10-CM | POA: Insufficient documentation

## 2013-03-18 DIAGNOSIS — Z862 Personal history of diseases of the blood and blood-forming organs and certain disorders involving the immune mechanism: Secondary | ICD-10-CM | POA: Insufficient documentation

## 2013-03-18 DIAGNOSIS — E119 Type 2 diabetes mellitus without complications: Secondary | ICD-10-CM | POA: Insufficient documentation

## 2013-03-18 DIAGNOSIS — Z7982 Long term (current) use of aspirin: Secondary | ICD-10-CM | POA: Insufficient documentation

## 2013-03-18 DIAGNOSIS — Z79899 Other long term (current) drug therapy: Secondary | ICD-10-CM | POA: Insufficient documentation

## 2013-03-18 DIAGNOSIS — E669 Obesity, unspecified: Secondary | ICD-10-CM | POA: Insufficient documentation

## 2013-03-18 LAB — CBC WITH DIFFERENTIAL/PLATELET
Basophils Absolute: 0 10*3/uL (ref 0.0–0.1)
Basophils Relative: 0 % (ref 0–1)
Eosinophils Relative: 0 % (ref 0–5)
HCT: 42.2 % (ref 36.0–46.0)
MCHC: 33.2 g/dL (ref 30.0–36.0)
MCV: 84.1 fL (ref 78.0–100.0)
Monocytes Absolute: 0.3 10*3/uL (ref 0.1–1.0)
Neutrophils Relative %: 85 % — ABNORMAL HIGH (ref 43–77)
RDW: 14.5 % (ref 11.5–15.5)
WBC: 5.5 10*3/uL (ref 4.0–10.5)

## 2013-03-18 LAB — GLUCOSE, CAPILLARY
Glucose-Capillary: 35 mg/dL — CL (ref 70–99)
Glucose-Capillary: 115 mg/dL — ABNORMAL HIGH (ref 70–99)

## 2013-03-18 LAB — COMPREHENSIVE METABOLIC PANEL
AST: 32 U/L (ref 0–37)
Albumin: 4.2 g/dL (ref 3.5–5.2)
Calcium: 10 mg/dL (ref 8.4–10.5)
Creatinine, Ser: 5.26 mg/dL — ABNORMAL HIGH (ref 0.50–1.10)
GFR calc non Af Amer: 7 mL/min — ABNORMAL LOW (ref >90)
Potassium: 4.5 mEq/L (ref 3.5–5.1)

## 2013-03-18 MED ORDER — CEPHALEXIN 500 MG PO CAPS: 500.0000 mg | ORAL_CAPSULE | Freq: Four times a day (QID) | ORAL | Status: DC
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
## 2013-03-18 NOTE — ED Notes (Signed)
Pt brought to ED by neighbor.  Says pt fell and doesn't know what caused her to fall.  Pt presently being treated for a UTI.  Neighbor reports pt very disoriented.  Pt oriented to person, place, and time.  Denies pain.

## 2013-03-24 ENCOUNTER — Encounter: Payer: Self-pay | Admitting: Internal Medicine

## 2013-03-27 ENCOUNTER — Other Ambulatory Visit: Payer: Self-pay | Admitting: Internal Medicine

## 2013-04-20 ENCOUNTER — Other Ambulatory Visit: Payer: Self-pay | Admitting: Family Medicine

## 2013-04-20 DIAGNOSIS — H34219 Partial retinal artery occlusion, unspecified eye: Secondary | ICD-10-CM

## 2013-04-22 ENCOUNTER — Telehealth: Payer: Self-pay | Admitting: Internal Medicine

## 2013-04-22 NOTE — Telephone Encounter (Addendum)
[name] would like to speak w/ dr Fabian Sharppanosh prior to the carotid duplex test that had been sch for 1/30.  [name] is going to cancel this test   she would like to know more about what this is and have someone sch this appt w/ her.

## 2013-04-24 ENCOUNTER — Encounter (HOSPITAL_COMMUNITY): Payer: Medicare Other

## 2013-04-28 ENCOUNTER — Other Ambulatory Visit: Payer: Self-pay | Admitting: Family Medicine

## 2013-04-28 ENCOUNTER — Emergency Department (HOSPITAL_COMMUNITY)
Admission: EM | Admit: 2013-04-28 | Discharge: 2013-04-28 | Disposition: A | Discharge status: Home / Self Care | Payer: Medicare Other | Attending: Emergency Medicine | Admitting: Emergency Medicine

## 2013-04-28 ENCOUNTER — Encounter (HOSPITAL_COMMUNITY): Payer: Self-pay | Admitting: Emergency Medicine

## 2013-04-28 ENCOUNTER — Emergency Department (HOSPITAL_COMMUNITY): Payer: Medicare Other

## 2013-04-28 DIAGNOSIS — Z950 Presence of cardiac pacemaker: Secondary | ICD-10-CM | POA: Insufficient documentation

## 2013-04-28 DIAGNOSIS — Z8619 Personal history of other infectious and parasitic diseases: Secondary | ICD-10-CM | POA: Insufficient documentation

## 2013-04-28 DIAGNOSIS — E785 Hyperlipidemia, unspecified: Secondary | ICD-10-CM | POA: Insufficient documentation

## 2013-04-28 DIAGNOSIS — I10 Essential (primary) hypertension: Secondary | ICD-10-CM | POA: Insufficient documentation

## 2013-04-28 DIAGNOSIS — Z9849 Cataract extraction status, unspecified eye: Secondary | ICD-10-CM | POA: Insufficient documentation

## 2013-04-28 DIAGNOSIS — E119 Type 2 diabetes mellitus without complications: Secondary | ICD-10-CM | POA: Insufficient documentation

## 2013-04-28 DIAGNOSIS — F17200 Nicotine dependence, unspecified, uncomplicated: Secondary | ICD-10-CM | POA: Insufficient documentation

## 2013-04-28 DIAGNOSIS — Z79899 Other long term (current) drug therapy: Secondary | ICD-10-CM | POA: Insufficient documentation

## 2013-04-28 DIAGNOSIS — Z7982 Long term (current) use of aspirin: Secondary | ICD-10-CM | POA: Insufficient documentation

## 2013-04-28 DIAGNOSIS — Z862 Personal history of diseases of the blood and blood-forming organs and certain disorders involving the immune mechanism: Secondary | ICD-10-CM | POA: Insufficient documentation

## 2013-04-28 DIAGNOSIS — M1990 Unspecified osteoarthritis, unspecified site: Secondary | ICD-10-CM | POA: Insufficient documentation

## 2013-04-28 DIAGNOSIS — Z87891 Personal history of nicotine dependence: Secondary | ICD-10-CM | POA: Insufficient documentation

## 2013-04-28 DIAGNOSIS — J449 Chronic obstructive pulmonary disease, unspecified: Secondary | ICD-10-CM | POA: Insufficient documentation

## 2013-04-28 DIAGNOSIS — Z8639 Personal history of other endocrine, nutritional and metabolic disease: Secondary | ICD-10-CM | POA: Insufficient documentation

## 2013-04-28 DIAGNOSIS — Z87448 Personal history of other diseases of urinary system: Secondary | ICD-10-CM | POA: Insufficient documentation

## 2013-04-28 DIAGNOSIS — N390 Urinary tract infection, site not specified: Secondary | ICD-10-CM | POA: Insufficient documentation

## 2013-04-28 DIAGNOSIS — R55 Syncope and collapse: Secondary | ICD-10-CM | POA: Insufficient documentation

## 2013-04-28 DIAGNOSIS — J4489 Other specified chronic obstructive pulmonary disease: Secondary | ICD-10-CM | POA: Insufficient documentation

## 2013-04-28 DIAGNOSIS — Z87828 Personal history of other (healed) physical injury and trauma: Secondary | ICD-10-CM | POA: Insufficient documentation

## 2013-04-28 DIAGNOSIS — Z86718 Personal history of other venous thrombosis and embolism: Secondary | ICD-10-CM | POA: Insufficient documentation

## 2013-04-28 DIAGNOSIS — E669 Obesity, unspecified: Secondary | ICD-10-CM | POA: Insufficient documentation

## 2013-04-28 LAB — URINALYSIS, ROUTINE W REFLEX MICROSCOPIC
BILIRUBIN URINE: NEGATIVE
GLUCOSE, UA: NEGATIVE mg/dL
KETONES UR: NEGATIVE mg/dL
Nitrite: NEGATIVE
Protein, ur: 100 mg/dL — AB
Specific Gravity, Urine: 1.02 (ref 1.005–1.030)
Urobilinogen, UA: 0.2 mg/dL (ref 0.0–1.0)
pH: 5.5 (ref 5.0–8.0)

## 2013-04-28 LAB — BASIC METABOLIC PANEL
BUN: 56 mg/dL — ABNORMAL HIGH (ref 6–23)
CALCIUM: 9.6 mg/dL (ref 8.4–10.5)
CO2: 27 mEq/L (ref 19–32)
Chloride: 100 mEq/L (ref 96–112)
Creatinine, Ser: 4.06 mg/dL — ABNORMAL HIGH (ref 0.50–1.10)
GFR calc Af Amer: 11 mL/min — ABNORMAL LOW (ref >90)
GFR, EST NON AFRICAN AMERICAN: 9 mL/min — AB (ref >90)
GLUCOSE: 94 mg/dL (ref 70–99)
Potassium: 3.9 mEq/L (ref 3.7–5.3)
Sodium: 143 mEq/L (ref 137–147)

## 2013-04-28 LAB — CBC WITH DIFFERENTIAL/PLATELET
Basophils Absolute: 0 10*3/uL (ref 0.0–0.1)
Basophils Relative: 0 % (ref 0–1)
Eosinophils Absolute: 0.1 10*3/uL (ref 0.0–0.7)
Eosinophils Relative: 2 % (ref 0–5)
HEMATOCRIT: 39.7 % (ref 36.0–46.0)
HEMOGLOBIN: 13.2 g/dL (ref 12.0–15.0)
Lymphocytes Relative: 18 % (ref 12–46)
Lymphs Abs: 0.7 10*3/uL (ref 0.7–4.0)
MCH: 28.1 pg (ref 26.0–34.0)
MCHC: 33.2 g/dL (ref 30.0–36.0)
MCV: 84.5 fL (ref 78.0–100.0)
MONO ABS: 0.2 10*3/uL (ref 0.1–1.0)
Monocytes Relative: 6 % (ref 3–12)
NEUTROS ABS: 2.9 10*3/uL (ref 1.7–7.7)
NEUTROS PCT: 74 % (ref 43–77)
Platelets: 111 10*3/uL — ABNORMAL LOW (ref 150–400)
RBC: 4.7 MIL/uL (ref 3.87–5.11)
RDW: 14.2 % (ref 11.5–15.5)
WBC: 3.9 10*3/uL — ABNORMAL LOW (ref 4.0–10.5)

## 2013-04-28 LAB — URINE MICROSCOPIC-ADD ON

## 2013-04-28 LAB — GLUCOSE, CAPILLARY: Glucose-Capillary: 83 mg/dL (ref 70–99)

## 2013-04-28 LAB — TROPONIN I: Troponin I: <0.3 ng/mL (ref <0.30)

## 2013-04-28 MED ORDER — CEFTRIAXONE SODIUM 1 G IJ SOLR
1.0000 g | Freq: Once | INTRAMUSCULAR | Status: AC
  Administered 2013-04-28: 1 g via INTRAVENOUS
  Filled 2013-04-28: qty 10

## 2013-04-28 MED ORDER — CEPHALEXIN 500 MG PO CAPS: 500.0000 mg | ORAL_CAPSULE | Freq: Four times a day (QID) | ORAL | Status: DC
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
## 2013-04-28 NOTE — ED Notes (Signed)
Pt's CBG is 83
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
## 2013-04-28 NOTE — Discharge Instructions (Signed)
Syncope Syncope means a person passes out (faints). The person usually wakes up in less than 5 minutes. It is important to seek medical care for syncope. HOME CARE  Have someone stay with you until you feel normal.  Do not drive, use machines, or play sports until your doctor says it is okay.  Keep all doctor visits as told.  Lie down when you feel like you might pass out. Take deep breaths. Wait until you feel normal before standing up.  Drink enough fluids to keep your pee (urine) clear or pale yellow.  If you take blood pressure or heart medicine, get up slowly. Take several minutes to sit and then stand. GET HELP RIGHT AWAY IF:   You have a severe headache.  You have pain in the chest, belly (abdomen), or back.  You are bleeding from the mouth or butt (rectum).  You have black or tarry poop (stool).  You have an irregular or very fast heartbeat.  You have pain with breathing.  You keep passing out, or you have shaking (seizures) when you pass out.  You pass out when sitting or lying down.  You feel confused.  You have trouble walking.  You have severe weakness.  You have vision problems. If you fainted, call your local emergency services (911 in [place] Do not drive yourself to the hospital. MAKE SURE YOU:   Understand these instructions.  Will watch your condition.  Will get help right away if you are not doing well or get worse. Document Released: 08/29/2007 Document Revised: 09/11/2011 Document Reviewed: 05/11/2011 [facility] Patient Information 2014 [place], [place].  Urinary Tract Infection A urinary tract infection (UTI) can occur any place along the urinary tract. The tract includes the kidneys, ureters, bladder, and urethra. A type of germ called bacteria often causes a UTI. UTIs are often helped with antibiotic medicine.  HOME CARE   If given, take antibiotics as told by your doctor. Finish them even if you start to feel better.  Drink enough fluids  to keep your pee (urine) clear or pale yellow.  Avoid tea, drinks with caffeine, and bubbly (carbonated) drinks.  Pee often. Avoid holding your pee in for a long time.  Pee before and after having sex (intercourse).  Wipe from front to back after you poop (bowel movement) if you are a woman. Use each tissue only once. GET HELP RIGHT AWAY IF:   You have back pain.  You have lower belly (abdominal) pain.  You have chills.  You feel sick to your stomach (nauseous).  You throw up (vomit).  Your burning or discomfort with peeing does not go away.  You have a fever.  Your symptoms are not better in 3 days. MAKE SURE YOU:   Understand these instructions.  Will watch your condition.  Will get help right away if you are not doing well or get worse. Document Released: 08/29/2007 Document Revised: 12/05/2011 Document Reviewed: 10/11/2011 [facility] Patient Information 2014 [place], [place].

## 2013-04-28 NOTE — Telephone Encounter (Signed)
Spoke to [name] who only needed the address for the doppler.  Advised that it is [address]. Instructed her to call back if more information is needed.

## 2013-04-30 LAB — URINE CULTURE

## 2013-05-02 ENCOUNTER — Telehealth (HOSPITAL_COMMUNITY): Payer: Self-pay | Admitting: Emergency Medicine

## 2013-05-02 NOTE — ED Notes (Signed)
Post ED Visit - Positive Culture Follow-up  Culture report reviewed by antimicrobial stewardship pharmacist: [] Wes Dulaney, Pharm.D., BCPS [] Jeremy Frens, Pharm.D., BCPS [] Elizabeth Martin, Pharm.D., BCPS [x] Minh Pham, Pharm.D., BCPS, AAHIVP [] Michelle Turner, Pharm.D., BCPS, AAHIVP  Positive urine culture Treated with Keflex, organism sensitive to the same and no further patient follow-up is required at this time.  Jaymin Waln 05/02/2013, 9:52 AM   

## 2013-05-04 ENCOUNTER — Other Ambulatory Visit: Payer: Self-pay | Admitting: Obstetrics & Gynecology

## 2013-05-04 ENCOUNTER — Encounter (INDEPENDENT_AMBULATORY_CARE_PROVIDER_SITE_OTHER): Payer: Self-pay

## 2013-05-06 ENCOUNTER — Ambulatory Visit (INDEPENDENT_AMBULATORY_CARE_PROVIDER_SITE_OTHER): Payer: Medicare Other | Admitting: *Deleted

## 2013-05-06 DIAGNOSIS — I498 Other specified cardiac arrhythmias: Secondary | ICD-10-CM

## 2013-05-06 LAB — MDC_IDC_ENUM_SESS_TYPE_INCLINIC
Battery Impedance: 6691 Ohm
Battery Remaining Longevity: 2 mo
Brady Statistic RV Percent Paced: 0 %
Lead Channel Impedance Value: 431 Ohm
Lead Channel Setting Pacing Pulse Width: 0.4 ms
MDC IDC MSMT BATTERY VOLTAGE: 2.62 V
MDC IDC MSMT LEADCHNL RA IMPEDANCE VALUE: 67 Ohm
MDC IDC SESS DTM: 20150211134527
MDC IDC SET LEADCHNL RV PACING AMPLITUDE: 2.5 V
MDC IDC SET LEADCHNL RV SENSING SENSITIVITY: 5.6 mV

## 2013-05-06 NOTE — Progress Notes (Signed)
Interrogation only.  Device @ [facility], reached 04/22/13.  ROV 2/26 @ 11:15am with Dr. Ladona Ridgelaylor to discuss change out.

## 2013-05-07 ENCOUNTER — Encounter (HOSPITAL_COMMUNITY): Payer: Medicare Other

## 2013-05-07 ENCOUNTER — Ambulatory Visit (HOSPITAL_COMMUNITY): Payer: Medicare Other | Attending: Cardiology

## 2013-05-07 DIAGNOSIS — E119 Type 2 diabetes mellitus without complications: Secondary | ICD-10-CM | POA: Insufficient documentation

## 2013-05-07 DIAGNOSIS — I658 Occlusion and stenosis of other precerebral arteries: Secondary | ICD-10-CM | POA: Insufficient documentation

## 2013-05-07 DIAGNOSIS — I10 Essential (primary) hypertension: Secondary | ICD-10-CM | POA: Insufficient documentation

## 2013-05-07 DIAGNOSIS — R209 Unspecified disturbances of skin sensation: Secondary | ICD-10-CM | POA: Insufficient documentation

## 2013-05-07 DIAGNOSIS — I6529 Occlusion and stenosis of unspecified carotid artery: Secondary | ICD-10-CM | POA: Insufficient documentation

## 2013-05-07 DIAGNOSIS — E785 Hyperlipidemia, unspecified: Secondary | ICD-10-CM | POA: Insufficient documentation

## 2013-05-07 DIAGNOSIS — I739 Peripheral vascular disease, unspecified: Secondary | ICD-10-CM | POA: Insufficient documentation

## 2013-05-07 DIAGNOSIS — R55 Syncope and collapse: Secondary | ICD-10-CM | POA: Insufficient documentation

## 2013-05-07 DIAGNOSIS — Z87891 Personal history of nicotine dependence: Secondary | ICD-10-CM | POA: Insufficient documentation

## 2013-05-07 DIAGNOSIS — H3400 Transient retinal artery occlusion, unspecified eye: Secondary | ICD-10-CM | POA: Insufficient documentation

## 2013-05-07 DIAGNOSIS — R42 Dizziness and giddiness: Secondary | ICD-10-CM

## 2013-05-13 ENCOUNTER — Other Ambulatory Visit: Payer: Self-pay | Admitting: Obstetrics & Gynecology

## 2013-05-21 ENCOUNTER — Encounter: Payer: Medicare Other | Admitting: Internal Medicine

## 2013-05-21 ENCOUNTER — Ambulatory Visit: Payer: Medicare Other | Admitting: Cardiovascular Disease

## 2013-05-25 ENCOUNTER — Telehealth: Payer: Self-pay | Admitting: Internal Medicine

## 2013-05-25 NOTE — Telephone Encounter (Signed)
Pt would like to ask you about a few things. Would like you to call after 5pm tonight. Refused to elaborate

## 2013-05-25 NOTE — Telephone Encounter (Signed)
Spoke to the pt and informed her that she has an appt with [facility] on 07/01/13.    She is concerned and confused about her insurance.  She does not know what she has.  She thinks she has [facility].  She has lost her wallet with her medical cards.  She has an appt tomorrow with Dr. Despina HiddenEure and is afraid that she will not be seen.  Can you look in our system to see what she has and if it is current?  Maybe we can fax over a copy to Dr. Forestine ChuteEure's office.  Please advise.  Thanks!

## 2013-05-26 ENCOUNTER — Other Ambulatory Visit: Payer: Self-pay | Admitting: Obstetrics & Gynecology

## 2013-05-26 ENCOUNTER — Telehealth: Payer: Self-pay | Admitting: Internal Medicine

## 2013-05-26 NOTE — Telephone Encounter (Signed)
Please see previous note.

## 2013-05-26 NOTE — Telephone Encounter (Signed)
Pt wants to speak with dr. Fabian Sharppanosh regarding a referral , pt states the doctor would not see her because of her insurance, pt states she has an appointment again today at 2:00. Pt was uncertain of the doctor name and where to go for the referral.  Pt states she need advice on what to do because when she go to the appointment the doctor refuse to see her.

## 2013-05-26 NOTE — Telephone Encounter (Signed)
Pt calling back.  Have you been able to check on her insurance status?

## 2013-05-26 NOTE — Telephone Encounter (Signed)
[facility](Dr. Eure[facility]) who will call patient to follow up with her regarding her appt.  Pt had an appt that was cancelled today.  Maralyn SagoSarah verified that patient has [employer] insurance and that was not a problem with her being seen at their office.

## 2013-05-27 ENCOUNTER — Encounter: Payer: Self-pay | Admitting: Internal Medicine

## 2013-06-01 ENCOUNTER — Encounter (HOSPITAL_COMMUNITY): Payer: Self-pay | Admitting: Pharmacy Technician

## 2013-06-01 ENCOUNTER — Ambulatory Visit (INDEPENDENT_AMBULATORY_CARE_PROVIDER_SITE_OTHER): Payer: Medicare Other | Admitting: Internal Medicine

## 2013-06-01 ENCOUNTER — Encounter: Payer: Self-pay | Admitting: Internal Medicine

## 2013-06-01 ENCOUNTER — Encounter: Payer: Self-pay | Admitting: *Deleted

## 2013-06-01 VITALS — BP 131/88 | HR 75 | Ht 65.5 in | Wt 150.0 lb

## 2013-06-01 DIAGNOSIS — I498 Other specified cardiac arrhythmias: Secondary | ICD-10-CM

## 2013-06-01 DIAGNOSIS — Z01818 Encounter for other preprocedural examination: Secondary | ICD-10-CM

## 2013-06-01 DIAGNOSIS — I10 Essential (primary) hypertension: Secondary | ICD-10-CM

## 2013-06-01 NOTE — Assessment & Plan Note (Signed)
Her blood pressure is well controlled. She will continue her current meds.  

## 2013-06-01 NOTE — Patient Instructions (Signed)
Your physician recommends that you schedule a follow-up appointment in: 3 months with Dr Ladona Ridgelaylor.  Your physician has recommended that you have a pacemaker inserted. A pacemaker is a small device that is placed under the skin of your chest or abdomen to help control abnormal heart rhythms. This device uses electrical pulses to prompt the heart to beat at a normal rate. Pacemakers are used to treat heart rhythms that are too slow. Wire (leads) are attached to the pacemaker that goes into the chambers of you heart. This is done in the hospital and usually requires and overnight stay. Please see the instruction sheet given to you today for more information.  Your physician recommends that you return for lab work Friday. CBC, BMET
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
## 2013-06-01 NOTE — Assessment & Plan Note (Signed)
Her PPM is at [employer]). I have discussed the risks/benefits/goals/and expectations of ppm removal and insertion of a new device and she wishes to proceed.

## 2013-06-04 LAB — CBC
HCT: 38.7 % (ref 36.0–46.0)
HEMOGLOBIN: 12.9 g/dL (ref 12.0–15.0)
MCH: 27.6 pg (ref 26.0–34.0)
MCHC: 33.3 g/dL (ref 30.0–36.0)
MCV: 82.7 fL (ref 78.0–100.0)
Platelets: 134 10*3/uL — ABNORMAL LOW (ref 150–400)
RBC: 4.68 MIL/uL (ref 3.87–5.11)
RDW: 15.7 % — ABNORMAL HIGH (ref 11.5–15.5)
WBC: 4.1 10*3/uL (ref 4.0–10.5)

## 2013-06-05 LAB — BASIC METABOLIC PANEL
BUN: 57 mg/dL — ABNORMAL HIGH (ref 6–23)
CO2: 28 mEq/L (ref 19–32)
Calcium: 9.6 mg/dL (ref 8.4–10.5)
Chloride: 103 mEq/L (ref 96–112)
Creat: 3.83 mg/dL — ABNORMAL HIGH (ref 0.50–1.10)
Glucose, Bld: 88 mg/dL (ref 70–99)
Potassium: 4.2 mEq/L (ref 3.5–5.3)
Sodium: 142 mEq/L (ref 135–145)

## 2013-06-08 ENCOUNTER — Telehealth: Payer: Self-pay | Admitting: *Deleted

## 2013-06-08 NOTE — Telephone Encounter (Signed)
LM with social services and patients daughter to discuss request for patient to spend the night after her generator change out on 06/11/13  See note from social services   Will forward to Dr.Taylor to see if over nite admission possible

## 2013-06-08 NOTE — Telephone Encounter (Signed)
Pt is scheduled to have a procedure done on 06/11/13 and jennifer with social services would like to know if we could switch it to a over night stay. Pt has dementia and is elderly and she will not have anyone to stay with her. Victorino DikeJennifer feels like if something went wrong the patient would not know it.

## 2013-06-11 ENCOUNTER — Ambulatory Visit (HOSPITAL_COMMUNITY)
Admission: RE | Admit: 2013-06-11 | Discharge: 2013-06-11 | Disposition: A | Discharge status: Home / Self Care | Payer: Medicare Other | Source: Ambulatory Visit | Attending: Internal Medicine | Admitting: Internal Medicine

## 2013-06-11 ENCOUNTER — Encounter (HOSPITAL_COMMUNITY)
Admission: RE | Disposition: A | Discharge status: Home / Self Care | Payer: Self-pay | Source: Ambulatory Visit | Attending: Internal Medicine

## 2013-06-11 DIAGNOSIS — E785 Hyperlipidemia, unspecified: Secondary | ICD-10-CM | POA: Insufficient documentation

## 2013-06-11 DIAGNOSIS — E669 Obesity, unspecified: Secondary | ICD-10-CM | POA: Diagnosis not present

## 2013-06-11 DIAGNOSIS — N186 End stage renal disease: Secondary | ICD-10-CM | POA: Insufficient documentation

## 2013-06-11 DIAGNOSIS — Z87891 Personal history of nicotine dependence: Secondary | ICD-10-CM | POA: Insufficient documentation

## 2013-06-11 DIAGNOSIS — J4489 Other specified chronic obstructive pulmonary disease: Secondary | ICD-10-CM | POA: Insufficient documentation

## 2013-06-11 DIAGNOSIS — E119 Type 2 diabetes mellitus without complications: Secondary | ICD-10-CM | POA: Diagnosis not present

## 2013-06-11 DIAGNOSIS — I714 Abdominal aortic aneurysm, without rupture, unspecified: Secondary | ICD-10-CM | POA: Diagnosis not present

## 2013-06-11 DIAGNOSIS — I120 Hypertensive chronic kidney disease with stage 5 chronic kidney disease or end stage renal disease: Secondary | ICD-10-CM | POA: Insufficient documentation

## 2013-06-11 DIAGNOSIS — Z45018 Encounter for adjustment and management of other part of cardiac pacemaker: Secondary | ICD-10-CM | POA: Diagnosis present

## 2013-06-11 DIAGNOSIS — Z7982 Long term (current) use of aspirin: Secondary | ICD-10-CM | POA: Diagnosis not present

## 2013-06-11 DIAGNOSIS — I495 Sick sinus syndrome: Secondary | ICD-10-CM

## 2013-06-11 DIAGNOSIS — Z86718 Personal history of other venous thrombosis and embolism: Secondary | ICD-10-CM | POA: Diagnosis not present

## 2013-06-11 DIAGNOSIS — J449 Chronic obstructive pulmonary disease, unspecified: Secondary | ICD-10-CM | POA: Diagnosis not present

## 2013-06-11 HISTORY — PX: PERMANENT PACEMAKER GENERATOR CHANGE: SHX6022

## 2013-06-11 LAB — SURGICAL PCR SCREEN
MRSA, PCR: NEGATIVE
STAPHYLOCOCCUS AUREUS: NEGATIVE

## 2013-06-11 LAB — GLUCOSE, CAPILLARY
GLUCOSE-CAPILLARY: 86 mg/dL (ref 70–99)
Glucose-Capillary: 79 mg/dL (ref 70–99)

## 2013-06-11 SURGERY — PERMANENT PACEMAKER GENERATOR CHANGE
Anesthesia: LOCAL

## 2013-06-11 MED ORDER — CEFAZOLIN SODIUM-DEXTROSE 2-3 GM-% IV SOLR
2.0000 g | INTRAVENOUS | Status: DC
  Filled 2013-06-11: qty 50

## 2013-06-11 MED ORDER — ONDANSETRON HCL 4 MG/2ML IJ SOLN: 4.0000 mg | Freq: Four times a day (QID) | INTRAMUSCULAR | Status: DC | PRN

## 2013-06-11 MED ORDER — LIDOCAINE HCL (PF) 1 % IJ SOLN
INTRAMUSCULAR | Status: AC
  Filled 2013-06-11: qty 60

## 2013-06-11 MED ORDER — CHLORHEXIDINE GLUCONATE 4 % EX LIQD
60.0000 mL | Freq: Once | CUTANEOUS | Status: DC
  Filled 2013-06-11: qty 60

## 2013-06-11 MED ORDER — SODIUM CHLORIDE 0.9 % IV SOLN
INTRAVENOUS | Status: DC
  Administered 2013-06-11: 12:00:00 via INTRAVENOUS

## 2013-06-11 MED ORDER — MUPIROCIN 2 % EX OINT
TOPICAL_OINTMENT | Freq: Once | CUTANEOUS | Status: AC
  Administered 2013-06-11: 1 via NASAL
  Filled 2013-06-11: qty 22

## 2013-06-11 MED ORDER — ACETAMINOPHEN 325 MG PO TABS
325.0000 mg | ORAL_TABLET | ORAL | Status: DC | PRN
  Filled 2013-06-11: qty 2

## 2013-06-11 MED ORDER — MUPIROCIN 2 % EX OINT
TOPICAL_OINTMENT | CUTANEOUS | Status: AC
  Filled 2013-06-11: qty 22

## 2013-06-11 MED ORDER — GENTAMICIN SULFATE 40 MG/ML IJ SOLN
80.0000 mg | INTRAMUSCULAR | Status: DC
  Filled 2013-06-11 (×2): qty 2

## 2013-06-11 NOTE — Discharge Instructions (Signed)
Pacemaker Battery Change, Care After  °Refer to this sheet in the next few weeks. These instructions provide you with information on caring for yourself after your procedure. Your health care provider may also give you more specific instructions. Your treatment has been planned according to current medical practices, but problems sometimes occur. Call your health care provider if you have any problems or questions after your procedure. °WHAT TO EXPECT AFTER THE PROCEDURE °After your procedure, it is typical to have the following sensations: °· Soreness at the pacemaker site. °HOME CARE INSTRUCTIONS  °· Keep the incision clean and dry. °· Unless advised otherwise, you may shower beginning 48 hours after your procedure. °· For the first week after the replacement, avoid stretching motions that pull at the incision site and avoid heavy exercise with the arm on the same side as the incision. °· Only take over-the-counter or prescription medicines for pain, discomfort, or fever as directed by your health care provider. °· Your health care provider will tell you when you will need to next test your pacemaker by telephone or when to return to the office for follow up for removal of stitches. °SEEK MEDICAL CARE IF:  °· You have pain at the incision site that is not relieved by over-the-counter or prescription medicine. °· There is drainage or pus from the incision site. °· There is swelling larger than a lime at the incision site. °· You develop red streaking that extends above or below the incision site. °· You feel brief, intermittent palpitations, lightheadedness, or any symptoms that you feel might be related to your heart. °SEEK IMMEDIATE MEDICAL CARE IF:  °· You experience chest pain that is different than the pain at the pacemaker site. °· Shortness of breath. °· Palpitations or irregular heart beat. °· Lightheadedness that does not go away quickly. °· Fainting. °· You have pain that gets worse and is not relieved by  medicine. °MAKE SURE YOU:  °· Understand these instructions. °· Will watch your condition. °· Will get help right away if you are not doing well or get worse. °Document Released: 12/31/2012 Document Reviewed: 09/24/2012 °ExitCare® Patient Information ©2014 ExitCare, LLC. ° °

## 2013-06-11 NOTE — Progress Notes (Signed)
Called Victorino DikeJennifer Reiter, SW who is pt's transportation to inform her that pt is ready for d/c. She stated that she would be here to pick pt up between 1630 and 1700. Pt informed.
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
## 2013-06-11 NOTE — CV Procedure (Signed)
EP Procedure Note  Procedure: Removal of a previous implanted dual-chamber pacemaker which had reached elective replacement and insertion of a new dual-chamber pacemaker  Indication: Dual-chamber pacemaker at elective replacement in a patient with sinus node dysfunction and symptomatic bradycardia  Description of the procedure: After informed consent was obtained, the patient was taken to the diagnostic electrophysiology laboratory in the fasting state. After the usual preparation draping, 30 cc of lidocaine was infiltrated into the right infraclavicular region over the old pacemaker insertion site. A 5 cm incision was carried out. Electrocautery was utilized to dissect down to the pacemaker pocket. The pacemaker generator was removed with gentle traction. The generator was removed from the old atrial and ventricular leads. The atrial and ventricular leads were evaluated and found to be working satisfactorily. The pocket was irrigated with antibiotic irrigation. The Medtronic dual-chamber pacemaker [facility]([place]) serial number [id number], was connected to the old atrial and ventricular pacing lead and placed back in the subcutaneous pocket. The pocket was irrigated with additional antibiotic irrigation. Electrocautery was utilized to assure hemostasis. The incision was closed with 2 layers of Vicryl suture. Benzoin and Steri-Strips for pain on the skin. A pressure dressing was placed and the patient was returned to the recovery area in satisfactory condition.  Complications: There were no immediate complications  Conclusion: Successful removal of a previous implanted dual-chamber pacemaker, which had reached elective replacement, and insertion of a new dual-chamber pacemaker in a patient with symptomatic sinus node dysfunction.  Lewayne BuntingGregg Taylor, M.D.

## 2013-06-11 NOTE — Interval H&P Note (Signed)
History and Physical Interval Note:  06/11/2013 1:14 PM  [name]  has presented today for surgery, with the diagnosis of eri  The various methods of treatment have been discussed with the patient and family. After consideration of risks, benefits and other options for treatment, the patient has consented to  Procedure(s): PERMANENT PACEMAKER GENERATOR CHANGE (N/A) as a surgical intervention .  The patient's history has been reviewed, patient examined, no change in status, stable for surgery.  I have reviewed the patient's chart and labs.  Questions were answered to the patient's satisfaction.     Leonia ReevesGregg Samvel Zinn,M.D.

## 2013-06-12 ENCOUNTER — Encounter: Payer: Self-pay | Admitting: Internal Medicine

## 2013-06-19 ENCOUNTER — Ambulatory Visit (INDEPENDENT_AMBULATORY_CARE_PROVIDER_SITE_OTHER): Payer: Medicare Other | Admitting: *Deleted

## 2013-06-19 DIAGNOSIS — I498 Other specified cardiac arrhythmias: Secondary | ICD-10-CM

## 2013-06-19 LAB — MDC_IDC_ENUM_SESS_TYPE_INCLINIC
Battery Impedance: 100 Ohm
Battery Voltage: 2.8 V
Brady Statistic AP VP Percent: 0 %
Brady Statistic AP VS Percent: 4 %
Brady Statistic AS VP Percent: 0 %
Brady Statistic AS VS Percent: 96 %
Date Time Interrogation Session: 20150327134558
Lead Channel Impedance Value: 427 Ohm
Lead Channel Impedance Value: 438 Ohm
Lead Channel Pacing Threshold Amplitude: 0.75 V
Lead Channel Pacing Threshold Amplitude: 1 V
Lead Channel Pacing Threshold Pulse Width: 0.4 ms
Lead Channel Pacing Threshold Pulse Width: 0.4 ms
Lead Channel Setting Pacing Amplitude: 2 V
Lead Channel Setting Sensing Sensitivity: 5.6 mV
MDC IDC MSMT BATTERY REMAINING LONGEVITY: 148 mo
MDC IDC MSMT LEADCHNL RA SENSING INTR AMPL: 4 mV
MDC IDC MSMT LEADCHNL RV SENSING INTR AMPL: 15.67 mV
MDC IDC SET LEADCHNL RV PACING AMPLITUDE: 2.5 V
MDC IDC SET LEADCHNL RV PACING PULSEWIDTH: 0.4 ms

## 2013-06-19 NOTE — Progress Notes (Signed)
Wound check-PPM in office. 

## 2013-06-24 ENCOUNTER — Other Ambulatory Visit: Payer: Self-pay | Admitting: Internal Medicine

## 2013-06-25 ENCOUNTER — Encounter: Payer: Self-pay | Admitting: Internal Medicine

## 2013-07-01 ENCOUNTER — Ambulatory Visit (INDEPENDENT_AMBULATORY_CARE_PROVIDER_SITE_OTHER): Payer: Medicare Other | Admitting: Internal Medicine

## 2013-07-01 ENCOUNTER — Encounter: Payer: Self-pay | Admitting: Internal Medicine

## 2013-07-01 VITALS — BP 126/70 | Temp 97.5°F | Ht 65.75 in | Wt 145.0 lb

## 2013-07-01 DIAGNOSIS — F0390 Unspecified dementia without behavioral disturbance: Secondary | ICD-10-CM

## 2013-07-01 DIAGNOSIS — Z87898 Personal history of other specified conditions: Secondary | ICD-10-CM

## 2013-07-01 DIAGNOSIS — E1121 Type 2 diabetes mellitus with diabetic nephropathy: Secondary | ICD-10-CM

## 2013-07-01 DIAGNOSIS — N19 Unspecified kidney failure: Secondary | ICD-10-CM

## 2013-07-01 DIAGNOSIS — Z950 Presence of cardiac pacemaker: Secondary | ICD-10-CM

## 2013-07-01 DIAGNOSIS — Z9189 Other specified personal risk factors, not elsewhere classified: Secondary | ICD-10-CM

## 2013-07-01 DIAGNOSIS — N184 Chronic kidney disease, stage 4 (severe): Secondary | ICD-10-CM

## 2013-07-01 DIAGNOSIS — I6529 Occlusion and stenosis of unspecified carotid artery: Secondary | ICD-10-CM

## 2013-07-01 DIAGNOSIS — N058 Unspecified nephritic syndrome with other morphologic changes: Secondary | ICD-10-CM

## 2013-07-01 DIAGNOSIS — E1129 Type 2 diabetes mellitus with other diabetic kidney complication: Secondary | ICD-10-CM
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
## 2013-07-01 NOTE — Patient Instructions (Addendum)
Please get advance directive health care power of attorney and financial power of attorney .  Can see laywer to set this up.  No labs today . No change in medication at this time. hopefully the passing out will stop after the pacer change .  ROV in 4 months  Or as needed

## 2013-07-08 ENCOUNTER — Other Ambulatory Visit (HOSPITAL_COMMUNITY)
Admission: RE | Admit: 2013-07-08 | Discharge: 2013-07-08 | Disposition: A | Discharge status: Home / Self Care | Payer: Medicare Other | Source: Ambulatory Visit | Attending: Obstetrics & Gynecology | Admitting: Obstetrics & Gynecology

## 2013-07-08 ENCOUNTER — Encounter: Payer: Self-pay | Admitting: Obstetrics & Gynecology

## 2013-07-08 ENCOUNTER — Ambulatory Visit (INDEPENDENT_AMBULATORY_CARE_PROVIDER_SITE_OTHER): Payer: Medicare Other | Admitting: Obstetrics & Gynecology

## 2013-07-08 VITALS — BP 140/90 | Ht 65.4 in | Wt 147.0 lb

## 2013-07-08 DIAGNOSIS — Z01419 Encounter for gynecological examination (general) (routine) without abnormal findings: Secondary | ICD-10-CM

## 2013-07-08 DIAGNOSIS — Z124 Encounter for screening for malignant neoplasm of cervix: Secondary | ICD-10-CM

## 2013-07-08 NOTE — Addendum Note (Signed)
Addended by: Colen DarlingYOUNG, Jonathen Rathman S on: 07/08/2013 03:05 PM   Modules accepted: Orders
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
## 2013-07-17 ENCOUNTER — Telehealth: Payer: Self-pay

## 2013-07-17 NOTE — Telephone Encounter (Signed)
Relevant patient education mailed to patient.  

## 2013-09-04 ENCOUNTER — Encounter: Payer: Self-pay | Admitting: Internal Medicine

## 2013-09-04 ENCOUNTER — Ambulatory Visit (INDEPENDENT_AMBULATORY_CARE_PROVIDER_SITE_OTHER): Payer: Medicare Other | Admitting: Internal Medicine

## 2013-09-04 VITALS — BP 140/78 | HR 72 | Ht 65.0 in | Wt 148.0 lb

## 2013-09-04 DIAGNOSIS — I10 Essential (primary) hypertension: Secondary | ICD-10-CM

## 2013-09-04 DIAGNOSIS — I6529 Occlusion and stenosis of unspecified carotid artery: Secondary | ICD-10-CM

## 2013-09-04 DIAGNOSIS — Z950 Presence of cardiac pacemaker: Secondary | ICD-10-CM

## 2013-09-04 DIAGNOSIS — I498 Other specified cardiac arrhythmias: Secondary | ICD-10-CM

## 2013-09-04 LAB — MDC_IDC_ENUM_SESS_TYPE_INCLINIC
Battery Impedance: 100 Ohm
Battery Voltage: 2.79 V
Brady Statistic AS VS Percent: 78 %
Date Time Interrogation Session: 20150612111717
Lead Channel Impedance Value: 405 Ohm
Lead Channel Impedance Value: 405 Ohm
Lead Channel Pacing Threshold Amplitude: 1 V
Lead Channel Pacing Threshold Amplitude: 1 V
Lead Channel Pacing Threshold Pulse Width: 0.4 ms
Lead Channel Sensing Intrinsic Amplitude: 15.67 mV
Lead Channel Setting Pacing Amplitude: 2 V
Lead Channel Setting Pacing Amplitude: 2.5 V
Lead Channel Setting Pacing Pulse Width: 0.4 ms
Lead Channel Setting Sensing Sensitivity: 5.6 mV
MDC IDC MSMT BATTERY REMAINING LONGEVITY: 142 mo
MDC IDC MSMT LEADCHNL RA PACING THRESHOLD PULSEWIDTH: 0.4 ms
MDC IDC MSMT LEADCHNL RA SENSING INTR AMPL: 4 mV
MDC IDC STAT BRADY AP VP PERCENT: 0 %
MDC IDC STAT BRADY AP VS PERCENT: 22 %
MDC IDC STAT BRADY AS VP PERCENT: 0 %

## 2013-09-04 NOTE — Patient Instructions (Signed)
Your physician recommends that you schedule a follow-up appointment in: 9 months with Dr Court Joyaylor You will receive a reminder letter two months in advance reminding you to call and schedule your appointment. If you don't receive this letter, please contact our office.

## 2013-09-07 ENCOUNTER — Other Ambulatory Visit: Payer: Self-pay | Admitting: Internal Medicine

## 2013-09-08 ENCOUNTER — Encounter: Payer: Self-pay | Admitting: Internal Medicine

## 2013-09-08 NOTE — Assessment & Plan Note (Signed)
Her Medtronic DDD PM is working normally. Will recheck in several months. 

## 2013-09-08 NOTE — Assessment & Plan Note (Signed)
Her blood pressure is reasonably well controlled. She is encouraged to continue her meds and maintain a low sodium diet.
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
## 2013-09-24 ENCOUNTER — Encounter (HOSPITAL_COMMUNITY): Payer: Self-pay | Admitting: Emergency Medicine

## 2013-09-24 ENCOUNTER — Emergency Department (HOSPITAL_COMMUNITY)
Admission: EM | Admit: 2013-09-24 | Discharge: 2013-09-24 | Disposition: A | Discharge status: Home / Self Care | Payer: Medicare Other | Attending: Emergency Medicine | Admitting: Emergency Medicine

## 2013-09-24 ENCOUNTER — Emergency Department (HOSPITAL_COMMUNITY): Payer: Medicare Other

## 2013-09-24 DIAGNOSIS — Z862 Personal history of diseases of the blood and blood-forming organs and certain disorders involving the immune mechanism: Secondary | ICD-10-CM | POA: Insufficient documentation

## 2013-09-24 DIAGNOSIS — Z8619 Personal history of other infectious and parasitic diseases: Secondary | ICD-10-CM | POA: Insufficient documentation

## 2013-09-24 DIAGNOSIS — Z87820 Personal history of traumatic brain injury: Secondary | ICD-10-CM | POA: Insufficient documentation

## 2013-09-24 DIAGNOSIS — Z87828 Personal history of other (healed) physical injury and trauma: Secondary | ICD-10-CM | POA: Insufficient documentation

## 2013-09-24 DIAGNOSIS — Z79899 Other long term (current) drug therapy: Secondary | ICD-10-CM | POA: Insufficient documentation

## 2013-09-24 DIAGNOSIS — E669 Obesity, unspecified: Secondary | ICD-10-CM | POA: Insufficient documentation

## 2013-09-24 DIAGNOSIS — Z87898 Personal history of other specified conditions: Secondary | ICD-10-CM

## 2013-09-24 DIAGNOSIS — E785 Hyperlipidemia, unspecified: Secondary | ICD-10-CM | POA: Insufficient documentation

## 2013-09-24 DIAGNOSIS — R55 Syncope and collapse: Secondary | ICD-10-CM | POA: Insufficient documentation

## 2013-09-24 DIAGNOSIS — Z7982 Long term (current) use of aspirin: Secondary | ICD-10-CM | POA: Insufficient documentation

## 2013-09-24 DIAGNOSIS — F0390 Unspecified dementia without behavioral disturbance: Secondary | ICD-10-CM

## 2013-09-24 DIAGNOSIS — Z8742 Personal history of other diseases of the female genital tract: Secondary | ICD-10-CM | POA: Insufficient documentation

## 2013-09-24 DIAGNOSIS — Z950 Presence of cardiac pacemaker: Secondary | ICD-10-CM | POA: Insufficient documentation

## 2013-09-24 DIAGNOSIS — I10 Essential (primary) hypertension: Secondary | ICD-10-CM | POA: Insufficient documentation

## 2013-09-24 DIAGNOSIS — J4489 Other specified chronic obstructive pulmonary disease: Secondary | ICD-10-CM | POA: Insufficient documentation

## 2013-09-24 DIAGNOSIS — Z86718 Personal history of other venous thrombosis and embolism: Secondary | ICD-10-CM | POA: Insufficient documentation

## 2013-09-24 DIAGNOSIS — Z87440 Personal history of urinary (tract) infections: Secondary | ICD-10-CM | POA: Insufficient documentation

## 2013-09-24 DIAGNOSIS — M1990 Unspecified osteoarthritis, unspecified site: Secondary | ICD-10-CM | POA: Insufficient documentation

## 2013-09-24 DIAGNOSIS — E119 Type 2 diabetes mellitus without complications: Secondary | ICD-10-CM | POA: Insufficient documentation

## 2013-09-24 DIAGNOSIS — J449 Chronic obstructive pulmonary disease, unspecified: Secondary | ICD-10-CM | POA: Insufficient documentation

## 2013-09-24 DIAGNOSIS — Z87891 Personal history of nicotine dependence: Secondary | ICD-10-CM | POA: Insufficient documentation

## 2013-09-24 LAB — CBC WITH DIFFERENTIAL/PLATELET
BASOS ABS: 0 10*3/uL (ref 0.0–0.1)
BASOS PCT: 0 % (ref 0–1)
EOS PCT: 2 % (ref 0–5)
Eosinophils Absolute: 0.1 10*3/uL (ref 0.0–0.7)
HCT: 38.3 % (ref 36.0–46.0)
Hemoglobin: 12.9 g/dL (ref 12.0–15.0)
Lymphocytes Relative: 21 % (ref 12–46)
Lymphs Abs: 0.7 10*3/uL (ref 0.7–4.0)
MCH: 28.5 pg (ref 26.0–34.0)
MCHC: 33.7 g/dL (ref 30.0–36.0)
MCV: 84.5 fL (ref 78.0–100.0)
Monocytes Absolute: 0.2 10*3/uL (ref 0.1–1.0)
Monocytes Relative: 7 % (ref 3–12)
NEUTROS ABS: 2.2 10*3/uL (ref 1.7–7.7)
Neutrophils Relative %: 70 % (ref 43–77)
Platelets: 127 10*3/uL — ABNORMAL LOW (ref 150–400)
RBC: 4.53 MIL/uL (ref 3.87–5.11)
RDW: 13.4 % (ref 11.5–15.5)
WBC: 3.1 10*3/uL — ABNORMAL LOW (ref 4.0–10.5)

## 2013-09-24 LAB — HEPATIC FUNCTION PANEL
ALT: 20 U/L (ref 0–35)
AST: 39 U/L — AB (ref 0–37)
Albumin: 3.9 g/dL (ref 3.5–5.2)
Alkaline Phosphatase: 78 U/L (ref 39–117)
Bilirubin, Direct: <0.2 mg/dL (ref 0.0–0.3)
Total Bilirubin: 0.4 mg/dL (ref 0.3–1.2)
Total Protein: 7.2 g/dL (ref 6.0–8.3)

## 2013-09-24 LAB — BASIC METABOLIC PANEL
ANION GAP: 13 (ref 5–15)
BUN: 65 mg/dL — ABNORMAL HIGH (ref 6–23)
CALCIUM: 9.5 mg/dL (ref 8.4–10.5)
CO2: 27 meq/L (ref 19–32)
CREATININE: 3.83 mg/dL — AB (ref 0.50–1.10)
Chloride: 101 mEq/L (ref 96–112)
GFR calc Af Amer: 11 mL/min — ABNORMAL LOW (ref >90)
GFR, EST NON AFRICAN AMERICAN: 10 mL/min — AB (ref >90)
Glucose, Bld: 99 mg/dL (ref 70–99)
Potassium: 3.6 mEq/L — ABNORMAL LOW (ref 3.7–5.3)
Sodium: 141 mEq/L (ref 137–147)

## 2013-09-24 LAB — LIPASE, BLOOD: Lipase: 80 U/L — ABNORMAL HIGH (ref 11–59)

## 2013-09-24 LAB — TROPONIN I

## 2013-09-24 LAB — PRO B NATRIURETIC PEPTIDE: PRO B NATRI PEPTIDE: 2627 pg/mL — AB (ref 0–450)

## 2013-09-24 MED ORDER — SODIUM CHLORIDE 0.9 % IV BOLUS (SEPSIS)
250.0000 mL | Freq: Once | INTRAVENOUS | Status: AC
  Administered 2013-09-24: 250 mL via INTRAVENOUS

## 2013-09-24 MED ORDER — SODIUM CHLORIDE 0.9 % IV SOLN
INTRAVENOUS | Status: DC
  Administered 2013-09-24: 15:00:00 via INTRAVENOUS

## 2013-09-24 NOTE — ED Notes (Signed)
[place] [facility]. Patient had syncopal episode. Upon EMS arrival, patient was unresponsive, diaphoretic, drooling with snoring respirations. NRB applied by EMS and patient became responsive. EMS reported weak radial pulses on arrival with initial NBP reading of 65. Patient with pacemaker. Arrives alert/oriented, but with no recall of events.
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
## 2013-09-24 NOTE — Discharge Instructions (Signed)
Followup with your cardiologist. Followup with your primary care Dr. next few days. Return for any new or worse symptoms. Return for any recurrent passing out. Your pacemaker was interrogated and showed no arrhythmias.

## 2013-09-24 NOTE — ED Notes (Signed)
Per Dr. Request - patient was given peanut butter and crackers plus sprite.

## 2013-10-28 ENCOUNTER — Encounter (HOSPITAL_COMMUNITY): Payer: Self-pay | Admitting: Emergency Medicine

## 2013-10-28 ENCOUNTER — Emergency Department (HOSPITAL_COMMUNITY): Payer: Medicare Other

## 2013-10-28 ENCOUNTER — Emergency Department (HOSPITAL_COMMUNITY)
Admission: EM | Admit: 2013-10-28 | Discharge: 2013-10-28 | Disposition: A | Discharge status: Home / Self Care | Payer: Medicare Other | Attending: Emergency Medicine | Admitting: Emergency Medicine

## 2013-10-28 DIAGNOSIS — N390 Urinary tract infection, site not specified: Secondary | ICD-10-CM | POA: Insufficient documentation

## 2013-10-28 DIAGNOSIS — R404 Transient alteration of awareness: Secondary | ICD-10-CM | POA: Insufficient documentation

## 2013-10-28 DIAGNOSIS — Z8619 Personal history of other infectious and parasitic diseases: Secondary | ICD-10-CM | POA: Insufficient documentation

## 2013-10-28 DIAGNOSIS — R55 Syncope and collapse: Secondary | ICD-10-CM | POA: Insufficient documentation

## 2013-10-28 DIAGNOSIS — Z79899 Other long term (current) drug therapy: Secondary | ICD-10-CM | POA: Diagnosis not present

## 2013-10-28 DIAGNOSIS — J449 Chronic obstructive pulmonary disease, unspecified: Secondary | ICD-10-CM | POA: Insufficient documentation

## 2013-10-28 DIAGNOSIS — Z862 Personal history of diseases of the blood and blood-forming organs and certain disorders involving the immune mechanism: Secondary | ICD-10-CM | POA: Diagnosis not present

## 2013-10-28 DIAGNOSIS — I10 Essential (primary) hypertension: Secondary | ICD-10-CM | POA: Diagnosis not present

## 2013-10-28 DIAGNOSIS — Z87891 Personal history of nicotine dependence: Secondary | ICD-10-CM | POA: Insufficient documentation

## 2013-10-28 DIAGNOSIS — Z86718 Personal history of other venous thrombosis and embolism: Secondary | ICD-10-CM | POA: Diagnosis not present

## 2013-10-28 DIAGNOSIS — Z8739 Personal history of other diseases of the musculoskeletal system and connective tissue: Secondary | ICD-10-CM | POA: Diagnosis not present

## 2013-10-28 DIAGNOSIS — Z8679 Personal history of other diseases of the circulatory system: Secondary | ICD-10-CM | POA: Diagnosis not present

## 2013-10-28 DIAGNOSIS — I951 Orthostatic hypotension: Secondary | ICD-10-CM

## 2013-10-28 DIAGNOSIS — Z8639 Personal history of other endocrine, nutritional and metabolic disease: Secondary | ICD-10-CM | POA: Insufficient documentation

## 2013-10-28 DIAGNOSIS — Z7982 Long term (current) use of aspirin: Secondary | ICD-10-CM | POA: Insufficient documentation

## 2013-10-28 DIAGNOSIS — Z87440 Personal history of urinary (tract) infections: Secondary | ICD-10-CM | POA: Insufficient documentation

## 2013-10-28 DIAGNOSIS — J4489 Other specified chronic obstructive pulmonary disease: Secondary | ICD-10-CM | POA: Insufficient documentation

## 2013-10-28 DIAGNOSIS — E669 Obesity, unspecified: Secondary | ICD-10-CM | POA: Insufficient documentation

## 2013-10-28 DIAGNOSIS — E785 Hyperlipidemia, unspecified: Secondary | ICD-10-CM | POA: Insufficient documentation

## 2013-10-28 DIAGNOSIS — E119 Type 2 diabetes mellitus without complications: Secondary | ICD-10-CM | POA: Diagnosis not present

## 2013-10-28 LAB — URINALYSIS, ROUTINE W REFLEX MICROSCOPIC
BILIRUBIN URINE: NEGATIVE
Glucose, UA: NEGATIVE mg/dL
KETONES UR: NEGATIVE mg/dL
Nitrite: NEGATIVE
PH: 5.5 (ref 5.0–8.0)
Protein, ur: 30 mg/dL — AB
SPECIFIC GRAVITY, URINE: 1.015 (ref 1.005–1.030)
UROBILINOGEN UA: 0.2 mg/dL (ref 0.0–1.0)

## 2013-10-28 LAB — CBC WITH DIFFERENTIAL/PLATELET
BASOS PCT: 0 % (ref 0–1)
Basophils Absolute: 0 10*3/uL (ref 0.0–0.1)
Eosinophils Absolute: 0.1 10*3/uL (ref 0.0–0.7)
Eosinophils Relative: 2 % (ref 0–5)
HEMATOCRIT: 35.7 % — AB (ref 36.0–46.0)
HEMOGLOBIN: 12 g/dL (ref 12.0–15.0)
LYMPHS ABS: 0.6 10*3/uL — AB (ref 0.7–4.0)
LYMPHS PCT: 14 % (ref 12–46)
MCH: 28.4 pg (ref 26.0–34.0)
MCHC: 33.6 g/dL (ref 30.0–36.0)
MCV: 84.4 fL (ref 78.0–100.0)
MONO ABS: 0.2 10*3/uL (ref 0.1–1.0)
Monocytes Relative: 5 % (ref 3–12)
NEUTROS ABS: 3.7 10*3/uL (ref 1.7–7.7)
Neutrophils Relative %: 79 % — ABNORMAL HIGH (ref 43–77)
Platelets: 181 10*3/uL (ref 150–400)
RBC: 4.23 MIL/uL (ref 3.87–5.11)
RDW: 13.1 % (ref 11.5–15.5)
WBC: 4.6 10*3/uL (ref 4.0–10.5)

## 2013-10-28 LAB — URINE MICROSCOPIC-ADD ON

## 2013-10-28 LAB — COMPREHENSIVE METABOLIC PANEL
ALBUMIN: 3.4 g/dL — AB (ref 3.5–5.2)
ALK PHOS: 92 U/L (ref 39–117)
ALT: 14 U/L (ref 0–35)
AST: 34 U/L (ref 0–37)
Anion gap: 15 (ref 5–15)
BUN: 63 mg/dL — AB (ref 6–23)
CO2: 26 meq/L (ref 19–32)
Calcium: 9.4 mg/dL (ref 8.4–10.5)
Chloride: 97 mEq/L (ref 96–112)
Creatinine, Ser: 4.26 mg/dL — ABNORMAL HIGH (ref 0.50–1.10)
GFR calc Af Amer: 10 mL/min — ABNORMAL LOW (ref >90)
GFR, EST NON AFRICAN AMERICAN: 9 mL/min — AB (ref >90)
Glucose, Bld: 136 mg/dL — ABNORMAL HIGH (ref 70–99)
POTASSIUM: 3.4 meq/L — AB (ref 3.7–5.3)
Sodium: 138 mEq/L (ref 137–147)
Total Bilirubin: 0.3 mg/dL (ref 0.3–1.2)
Total Protein: 7.8 g/dL (ref 6.0–8.3)

## 2013-10-28 LAB — CBG MONITORING, ED: Glucose-Capillary: 140 mg/dL — ABNORMAL HIGH (ref 70–99)

## 2013-10-28 LAB — TROPONIN I: Troponin I: <0.3 ng/mL (ref <0.30)

## 2013-10-28 MED ORDER — CEPHALEXIN 500 MG PO CAPS: 500.0000 mg | ORAL_CAPSULE | Freq: Four times a day (QID) | ORAL | Status: DC

## 2013-10-28 MED ORDER — SODIUM CHLORIDE 0.9 % IV SOLN
Freq: Once | INTRAVENOUS | Status: AC
  Administered 2013-10-28: 500 mL via INTRAVENOUS

## 2013-10-28 MED ORDER — CEPHALEXIN 500 MG PO CAPS
500.0000 mg | ORAL_CAPSULE | Freq: Once | ORAL | Status: AC
  Administered 2013-10-28: 500 mg via ORAL
  Filled 2013-10-28: qty 1

## 2013-10-28 NOTE — ED Notes (Signed)
US at bedside

## 2013-10-28 NOTE — ED Notes (Signed)
Per EMS, [place] and [place].
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
## 2013-10-28 NOTE — ED Notes (Signed)
Pt daughters Jerene Dillingngrid and [place] as well as Dr.Harrison feel that the pt does not need to be living alone. MD feels pt is safe for d/c tonight as she is going home with family. MD believes more permanent living arrangements should be made for pt to live either with family or an assisted living.

## 2013-10-29 ENCOUNTER — Telehealth: Payer: Self-pay | Admitting: Internal Medicine

## 2013-10-29 NOTE — Telephone Encounter (Signed)
Pt has been to ed every mo for the last 3 months. Pt's children wants her placed. Victorino DikeJennifer w/ social services states she believes pt is reaching that point that she cannot take care of herself. Pt is forgetting to eat and drink. Pt still has the uti that she came to Dr Fabian SharpPanosh for. They wonder if pt even took her meds. Pt went yesterday to ed.and this was discovered. (i don't see where she came in for a uti) Victorino DikeJennifer would like you to know the children will be contacting Dr Fabian SharpPanosh. Hopefully, everyone will get together.  Children want her placed in [place] They will have to get FL2..  Pt will probably not agree. jennifer is really worried about pt. Her cell number [phone] as she is out w/ clients most of the time.

## 2013-10-29 NOTE — Telephone Encounter (Signed)
Disposition is best when patient is in hospital setting  And Social services consult for placement is made   I I can fill out fl2  If given to us   but obviously placement is outside of providers realm. Lanice Schwab.   Misty, also [facility] if [facility]  To help  withthis   And make sure no medications  "On the shelf so to speak " that she could be mistakenly taking that could cause a problem.( such as her old diabetes medications)  Will be out of town next week .  Please let dr Ladona Ridgelaylor know that she had been to ed for syncope  Multiple times .  And see if he needs to recheck her or not.

## 2013-10-30 NOTE — Telephone Encounter (Signed)
Caller: Ingrid/Child; Phone: [phone]; Reason for Call: Getting out of [place] to go into post[place] (assisted by Victorino DikeJennifer from [employer] Service), vomited then passed out.  911 was called and seen in [facility].  Dx, malnutrition, dehydration, kidney failure.  Has been confused for about 5 years but now much worse.  Asking same questions over and over, has been lightheaded, is not eating or taking her medications.  Has become very mean and bitter.  Today 8/7 could not lift her leg to go upstairs due to weakness - feels she did not understand how to lift leg to step up.  [name].  Has been falling about once per month.  Family very concerned about her being alone, feels she is not safe to care for self.  Wanting to have her in facility where someone can monitor her, be sure she eating and taking her medicines since sometimes does not take them and forgets to eat.  Asking for guidance, social worker saying MD can fill out paper for her to be in place for her safety.   Please review and asking Panosh's recommendations.  And for help with this situation.   Can reach daughter/Ingrid at cell [phone].

## 2013-10-31 LAB — URINE CULTURE

## 2013-11-01 ENCOUNTER — Telehealth (HOSPITAL_BASED_OUTPATIENT_CLINIC_OR_DEPARTMENT_OTHER): Payer: Self-pay | Admitting: Emergency Medicine

## 2013-11-01 NOTE — Telephone Encounter (Signed)
Post ED Visit - Positive Culture Follow-up  Culture report reviewed by antimicrobial stewardship pharmacist: []  Wes Dulaney, Pharm.D., BCPS []  Celedonio MiyamotoJeremy Frens, Pharm.D., BCPS []  Georgina PillionElizabeth Martin, Pharm.D., BCPS []  EssexMinh Pham, [address] Rainbow BoulevardPharm.D., BCPS, AAHIVP [x]  Estella HuskMichelle Turner, Pharm.D., BCPS, AAHIVP []  Red ChristiansSamson Lee, Pharm.D. []  Cassie Roseanne RenoStewart, Vermont[place]arm.D.  Positive urine culture Treated with Keflex, organism sensitive to the same and no further patient follow-up is required at this time.  SasserHolland, Jenel LucksKylie 11/01/2013, 1:09 PM

## 2013-11-02 NOTE — Telephone Encounter (Signed)
Left a message for [facility]Jennifer [facility] to return my call.

## 2013-11-02 NOTE — Telephone Encounter (Signed)
Spoke to ElizabethtonJennifer.  She informed me that she has been in contact with the patient's family.  Notified the family that the patient has not been found to be incompetent.  Therefore, she cannot be forced to go into a facility.  Notified the family that they may request guardianship of the pt through the court system.  Agreed with all.  Informed Victorino DikeJennifer that any FL2 paperwork must come from the facility that she will enter.  We do not keep FL2 forms at the office.  She will notify the family of that information.  Instructed her to call back if any further assistance is needed.

## 2013-11-10 ENCOUNTER — Encounter: Payer: Self-pay | Admitting: Internal Medicine

## 2013-11-10 ENCOUNTER — Ambulatory Visit (INDEPENDENT_AMBULATORY_CARE_PROVIDER_SITE_OTHER): Payer: Medicare Other | Admitting: Internal Medicine

## 2013-11-10 VITALS — BP 112/68 | HR 81 | Temp 97.9°F | Ht 65.5 in | Wt 145.0 lb

## 2013-11-10 DIAGNOSIS — Z9189 Other specified personal risk factors, not elsewhere classified: Secondary | ICD-10-CM

## 2013-11-10 DIAGNOSIS — I6529 Occlusion and stenosis of unspecified carotid artery: Secondary | ICD-10-CM

## 2013-11-10 DIAGNOSIS — N184 Chronic kidney disease, stage 4 (severe): Secondary | ICD-10-CM

## 2013-11-10 DIAGNOSIS — I10 Essential (primary) hypertension: Secondary | ICD-10-CM

## 2013-11-10 DIAGNOSIS — Z87898 Personal history of other specified conditions: Secondary | ICD-10-CM

## 2013-11-10 DIAGNOSIS — F0390 Unspecified dementia without behavioral disturbance: Secondary | ICD-10-CM

## 2013-11-10 DIAGNOSIS — E441 Mild protein-calorie malnutrition: Secondary | ICD-10-CM | POA: Insufficient documentation

## 2013-11-10 DIAGNOSIS — Z950 Presence of cardiac pacemaker: Secondary | ICD-10-CM

## 2013-11-10 NOTE — Patient Instructions (Addendum)
I will contact cardiology about  other causes of passing out. I do not think you should  live alone. for safety reasons. I need your medications to be double checked . To make sure  Need  dietary and liquid intake .   For  2 weeks and then  a nutritional.  consult.  It is possible that blood pressure to go too low or dehydration.  Blood pressure is good today.   Plan rov in 1-2 months or as needed.        Dementia Dementia is a general term for problems with brain function. A person with dementia has memory loss and a hard time with at least one other brain function such as thinking, speaking, or problem solving. Dementia can affect social functioning, how you do your job, your mood, or your personality. The changes may be hidden for a long time. The earliest forms of this disease are usually not detected by family or friends. Dementia can be:  Irreversible.  Potentially reversible.  Partially reversible.  Progressive. This means it can get worse over time. CAUSES  Irreversible dementia causes may include:  Degeneration of brain cells (Alzheimer disease or Lewy body dementia).  Multiple small strokes (vascular dementia).  Infection (chronic meningitis or Creutzfeldt-Jakob disease).  Frontotemporal dementia. This affects younger people, age 44 to 89, compared to those who have Alzheimer disease.  Dementia associated with other disorders like Parkinson disease, Huntington disease, or HIV-associated dementia. Potentially or partially reversible dementia causes may include:  Medicines.  Metabolic causes such as excessive alcohol intake, vitamin B12 deficiency, or thyroid disease.  Masses or pressure in the brain such as a tumor, blood clot, or hydrocephalus. SIGNS AND SYMPTOMS  Symptoms are often hard to detect. Family members or coworkers may not notice them early in the disease process. Different people with dementia may have different symptoms. Symptoms can include:  A  hard time with memory, especially recent memory. Long-term memory may not be impaired.  Asking the same question multiple times or forgetting something someone just said.  A hard time speaking your thoughts or finding certain words.  A hard time solving problems or performing familiar tasks (such as how to use a telephone).  Sudden changes in mood.  Changes in personality, especially increasing moodiness or mistrust.  Depression.  A hard time understanding complex ideas that were never a problem in the past. DIAGNOSIS  There are no specific tests for dementia.   Your health care provider may recommend a thorough evaluation. This is because some forms of dementia can be reversible. The evaluation will likely include a physical exam and getting a detailed history from you and a family member. The history often gives the best clues and suggestions for a diagnosis.  Memory testing may be done. A detailed brain function evaluation called neuropsychologic testing may be helpful.  Lab tests and brain imaging (such as a CT scan or MRI scan) are sometimes important.  Sometimes observation and re-evaluation over time is very helpful. TREATMENT  Treatment depends on the cause.   If the problem is a vitamin deficiency, it may be helped or cured with supplements.  For dementias such as Alzheimer disease, medicines are available to stabilize or slow the course of the disease. There are no cures for this type of dementia.  Your health care provider can help direct you to groups, organizations, and other health care providers to help with decisions in the care of you or your loved one. HOME CARE  INSTRUCTIONS The care of individuals with dementia is varied and dependent upon the progression of the dementia. The following suggestions are intended for the person living with, or caring for, the person with dementia.  Create a safe environment.  Remove the locks on bathroom doors to prevent the  person from accidentally locking himself or herself in.  Use childproof latches on kitchen cabinets and any place where cleaning supplies, chemicals, or alcohol are kept.  Use childproof covers in unused electrical outlets.  Install childproof devices to keep doors and windows secured.  Remove stove knobs or install safety knobs and an automatic shut-off on the stove.  Lower the temperature on water heaters.  Label medicines and keep them locked up.  Secure knives, lighters, matches, power tools, and guns, and keep these items out of reach.  Keep the house free from clutter. Remove rugs or anything that might contribute to a fall.  Remove objects that might break and hurt the person.  Make sure lighting is good, both inside and outside.  Install grab rails as needed.  Use a monitoring device to alert you to falls or other needs for help.  Reduce confusion.  Keep familiar objects and people around.  Use night lights or dim lights at night.  Label items or areas.  Use reminders, notes, or directions for daily activities or tasks.  Keep a simple, consistent routine for waking, meals, bathing, dressing, and bedtime.  Create a calm, quiet environment.  Place large clocks and calendars prominently.  Display emergency numbers and home address near all telephones.  Use cues to establish different times of the day. An example is to open curtains to let the natural light in during the day.   Use effective communication.  Choose simple words and short sentences.  Use a gentle, calm tone of voice.  Be careful not to interrupt.  If the person is struggling to find a word or communicate a thought, try to provide the word or thought.  Ask one question at a time. Allow the person ample time to answer questions. Repeat the question again if the person does not respond.  Reduce nighttime restlessness.  Provide a comfortable bed.  Have a consistent nighttime  routine.  Ensure a regular walking or physical activity schedule. Involve the person in daily activities as much as possible.  Limit napping during the day.  Limit caffeine.  Attend social events that stimulate rather than overwhelm the senses.  Encourage good nutrition and hydration.  Reduce distractions during meal times and snacks.  Avoid foods that are too hot or too cold.  Monitor chewing and swallowing ability.  Continue with routine vision, hearing, dental, and medical screenings.  Give medicines only as directed by the health care provider.  Monitor driving abilities. Do not allow the person to drive when safe driving is no longer possible.  Register with an identification program which could provide location assistance in the event of a missing person situation. SEEK MEDICAL CARE IF:   New behavioral problems start such as moodiness, aggressiveness, or seeing things that are not there (hallucinations).  Any new problem with brain function happens. This includes problems with balance, speech, or falling a lot.  Problems with swallowing develop.  Any symptoms of other illness happen. Small changes or worsening in any aspect of brain function can be a sign that the illness is getting worse. It can also be a sign of another medical illness such as infection. Seeing a health care provider right  away is important. SEEK IMMEDIATE MEDICAL CARE IF:   A fever develops.  New or worsened confusion develops.  New or worsened sleepiness develops.  Staying awake becomes hard to do. Document Released: 09/05/2000 Document Revised: 07/27/2013 Document Reviewed: 08/07/2010 [facility] Patient Information 2015 [place], [place]. This information is not intended to replace advice given to you by your health care provider. Make sure you discuss any questions you have with your health care provider.
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
## 2013-11-10 NOTE — Progress Notes (Signed)
Pre visit review using our clinic review tool, if applicable. No additional management support is needed unless otherwise documented below in the visit note. 

## 2013-11-11 ENCOUNTER — Telehealth: Payer: Self-pay | Admitting: Internal Medicine

## 2013-11-11 NOTE — Telephone Encounter (Signed)
Relevant patient education mailed to patient.  

## 2013-11-12 ENCOUNTER — Telehealth: Payer: Self-pay | Admitting: Internal Medicine

## 2013-11-12 NOTE — Telephone Encounter (Signed)
Spoke to the pt's daughter.  She informed me that she had dropped off the FL2 form at the front desk.  Needs filled out ASAP for [facility].  Advised that I will call her as soon as I get the form so she can help me to answer questions on the form.  [facility] and will call Jerene Dillingngrid for pick up.

## 2013-11-12 NOTE — Telephone Encounter (Signed)
The patient's daughter [name]([name]) came into the office with a [facility] FL2 Form to be filled out.  She would like to talk to you about it before Dr. Fabian SharpPanosh fills out the form. Please call her at [phone].

## 2013-11-13 NOTE — Telephone Encounter (Signed)
Called and left a message for [name] to return my call.

## 2013-11-13 NOTE — Telephone Encounter (Signed)
Ingrid notified to pick up at the front desk on Monday 11/16/13

## 2013-11-18 ENCOUNTER — Telehealth: Payer: Self-pay | Admitting: Internal Medicine

## 2013-11-18 DIAGNOSIS — R55 Syncope and collapse: Secondary | ICD-10-CM

## 2013-11-18 DIAGNOSIS — F0391 Unspecified dementia with behavioral disturbance: Secondary | ICD-10-CM

## 2013-11-18 NOTE — Telephone Encounter (Signed)
Spoke to [name] (daughter).  She states the pt is not eating, does not take her medicine, does not shower and perform daily hygiene.  She will argue with [name] and say that she has done these things when it is evident that she has not.  Is now screaming and cursing at the family.  She is up pacing the floors in the middle of the night.  [name] is worried for her mother's health.  [name] has spoke to social services and was advised that if Dr. [name] will give a hand written comprehensive assessment than it will help get the pt into an assisted living facility.  [name], her son and sister are at "their wits end."   [name] also reports that in 2013 she was granted POA but not guardianship buy a judge.

## 2013-11-18 NOTE — Telephone Encounter (Signed)
Pt's daughter asked you to call her asap, stating it is very important.

## 2013-11-19 NOTE — Telephone Encounter (Signed)
Will help but dont know what a written comprehensive assessment means   ? Copy last ov?

## 2013-11-20 ENCOUNTER — Encounter: Payer: Self-pay | Admitting: Internal Medicine

## 2013-11-20 NOTE — Telephone Encounter (Signed)
Spoke to [place] for clarification of what a written comprehensive assessment is.  She did not know.  She directed me to call Mr. De Blanch from [facility] on [address].  Called and left a message on his cell asking him to return my call.  De Blanch: [phone](cell)                    [phone](office)

## 2013-11-24 NOTE — Telephone Encounter (Signed)
Please refer to neurology  for evaluation of recurrent syncope and dementia  Evaluation. (As opposed to just coming back in for an updated mmse and clock drawing ) .

## 2013-11-24 NOTE — Telephone Encounter (Signed)
Spoke to [employer].  He explained that a comprehensive test is something the dr performs.  He/she may have the pt draw a clock with a specific time.  He explained there were several tests such as this and the results could be in an OV note.  Should we have the pt come back in for an appt?

## 2013-11-26 NOTE — Telephone Encounter (Signed)
Referral placed in the system.  Ingrid notified by telephone.

## 2013-12-02 ENCOUNTER — Other Ambulatory Visit: Payer: Self-pay | Admitting: Family Medicine

## 2013-12-02 ENCOUNTER — Telehealth: Payer: Self-pay | Admitting: Internal Medicine

## 2013-12-02 DIAGNOSIS — R109 Unspecified abdominal pain: Secondary | ICD-10-CM

## 2013-12-02 NOTE — Telephone Encounter (Signed)
Spoke to [place].  She stated that the patient complained of stomach pain this morning.  Denied any other sx or fever.  Pt states that she gets this pain when she has the start of a UTI.   [name] would like medication sent to the [facility] on [address].

## 2013-12-02 NOTE — Telephone Encounter (Signed)
Spoke to [place] and advise that she come and pick up specimen cup, clean catch wipe and hat to help catch the urine.  She will send her son.  Will bring the specimen back on 12/03/13.  Pt placed on the lab schedule and advised to refrigerate the urine over night.

## 2013-12-02 NOTE — Telephone Encounter (Signed)
Left a message for [name] to return my call.

## 2013-12-02 NOTE — Telephone Encounter (Signed)
Ok to get specimen CC  Do ua and Ucx. And  May start her on med if needed.

## 2013-12-02 NOTE — Telephone Encounter (Signed)
Pt daughter stated he mother has another uti and needs refill on abx. Pt does not remember the name of abx. [facility] on [address]. This will be new pharm

## 2013-12-03 ENCOUNTER — Other Ambulatory Visit (INDEPENDENT_AMBULATORY_CARE_PROVIDER_SITE_OTHER): Payer: Medicare Other

## 2013-12-03 DIAGNOSIS — R109 Unspecified abdominal pain: Secondary | ICD-10-CM

## 2013-12-03 LAB — POCT URINALYSIS DIPSTICK
BILIRUBIN UA: NEGATIVE
GLUCOSE UA: NEGATIVE
Ketones, UA: NEGATIVE
NITRITE UA: NEGATIVE
Spec Grav, UA: <=1.005
Urobilinogen, UA: 0.2
pH, UA: 6

## 2013-12-04 ENCOUNTER — Other Ambulatory Visit: Payer: Self-pay | Admitting: Internal Medicine

## 2013-12-04 ENCOUNTER — Other Ambulatory Visit: Payer: Self-pay | Admitting: Family Medicine

## 2013-12-04 MED ORDER — CEFUROXIME AXETIL 250 MG PO TABS: 250.0000 mg | ORAL_TABLET | Freq: Two times a day (BID) | ORAL | Status: DC

## 2013-12-04 NOTE — Telephone Encounter (Signed)
Sent to the pharmacy by e-scribe. 

## 2013-12-05 LAB — URINE CULTURE
Colony Count: NO GROWTH
Organism ID, Bacteria: NO GROWTH

## 2013-12-17 ENCOUNTER — Emergency Department (HOSPITAL_COMMUNITY)
Admission: EM | Admit: 2013-12-17 | Discharge: 2013-12-17 | Discharge status: Against Medical Advice | Payer: Medicare Other | Attending: Emergency Medicine | Admitting: Emergency Medicine

## 2013-12-17 ENCOUNTER — Encounter (HOSPITAL_COMMUNITY): Payer: Self-pay | Admitting: Emergency Medicine

## 2013-12-17 ENCOUNTER — Telehealth: Payer: Self-pay | Admitting: Internal Medicine

## 2013-12-17 DIAGNOSIS — I10 Essential (primary) hypertension: Secondary | ICD-10-CM | POA: Insufficient documentation

## 2013-12-17 DIAGNOSIS — R630 Anorexia: Secondary | ICD-10-CM | POA: Diagnosis not present

## 2013-12-17 DIAGNOSIS — E669 Obesity, unspecified: Secondary | ICD-10-CM | POA: Insufficient documentation

## 2013-12-17 DIAGNOSIS — R109 Unspecified abdominal pain: Secondary | ICD-10-CM | POA: Diagnosis not present

## 2013-12-17 DIAGNOSIS — E119 Type 2 diabetes mellitus without complications: Secondary | ICD-10-CM | POA: Insufficient documentation

## 2013-12-17 DIAGNOSIS — J449 Chronic obstructive pulmonary disease, unspecified: Secondary | ICD-10-CM | POA: Diagnosis not present

## 2013-12-17 DIAGNOSIS — J4489 Other specified chronic obstructive pulmonary disease: Secondary | ICD-10-CM | POA: Insufficient documentation

## 2013-12-17 LAB — CBC WITH DIFFERENTIAL/PLATELET
Basophils Absolute: 0 10*3/uL (ref 0.0–0.1)
Basophils Relative: 1 % (ref 0–1)
EOS ABS: 0.1 10*3/uL (ref 0.0–0.7)
Eosinophils Relative: 2 % (ref 0–5)
HCT: 36.8 % (ref 36.0–46.0)
Hemoglobin: 12.3 g/dL (ref 12.0–15.0)
LYMPHS ABS: 0.9 10*3/uL (ref 0.7–4.0)
Lymphocytes Relative: 26 % (ref 12–46)
MCH: 27.8 pg (ref 26.0–34.0)
MCHC: 33.4 g/dL (ref 30.0–36.0)
MCV: 83.3 fL (ref 78.0–100.0)
Monocytes Absolute: 0.3 10*3/uL (ref 0.1–1.0)
Monocytes Relative: 8 % (ref 3–12)
NEUTROS PCT: 63 % (ref 43–77)
Neutro Abs: 2.3 10*3/uL (ref 1.7–7.7)
Platelets: 160 10*3/uL (ref 150–400)
RBC: 4.42 MIL/uL (ref 3.87–5.11)
RDW: 14 % (ref 11.5–15.5)
WBC: 3.6 10*3/uL — AB (ref 4.0–10.5)

## 2013-12-17 LAB — URINE MICROSCOPIC-ADD ON

## 2013-12-17 LAB — COMPREHENSIVE METABOLIC PANEL
ALBUMIN: 4.1 g/dL (ref 3.5–5.2)
ALK PHOS: 90 U/L (ref 39–117)
ALT: 8 U/L (ref 0–35)
AST: 27 U/L (ref 0–37)
Anion gap: 14 (ref 5–15)
BUN: 54 mg/dL — ABNORMAL HIGH (ref 6–23)
CO2: 28 mEq/L (ref 19–32)
Calcium: 9.9 mg/dL (ref 8.4–10.5)
Chloride: 97 mEq/L (ref 96–112)
Creatinine, Ser: 4.51 mg/dL — ABNORMAL HIGH (ref 0.50–1.10)
GFR calc non Af Amer: 8 mL/min — ABNORMAL LOW (ref >90)
GFR, EST AFRICAN AMERICAN: 9 mL/min — AB (ref >90)
GLUCOSE: 136 mg/dL — AB (ref 70–99)
Potassium: 5.5 mEq/L — ABNORMAL HIGH (ref 3.7–5.3)
Sodium: 139 mEq/L (ref 137–147)
Total Bilirubin: 0.3 mg/dL (ref 0.3–1.2)
Total Protein: 8.1 g/dL (ref 6.0–8.3)

## 2013-12-17 LAB — URINALYSIS, ROUTINE W REFLEX MICROSCOPIC
BILIRUBIN URINE: NEGATIVE
Glucose, UA: NEGATIVE mg/dL
Ketones, ur: NEGATIVE mg/dL
NITRITE: NEGATIVE
Protein, ur: 30 mg/dL — AB
SPECIFIC GRAVITY, URINE: 1.005 (ref 1.005–1.030)
UROBILINOGEN UA: 0.2 mg/dL (ref 0.0–1.0)
pH: 6.5 (ref 5.0–8.0)

## 2013-12-17 LAB — LIPASE, BLOOD: Lipase: 55 U/L (ref 11–59)

## 2013-12-17 NOTE — Telephone Encounter (Signed)
Pt went to ED

## 2013-12-17 NOTE — ED Notes (Signed)
Pt here with daughter. Daughter sts over that over the past week she has had a decreased appetite, has vomited last night and last Saturday. sts that she has been taking her medicine on an empty stomach. Pt denies abd pain, but having nausea. Pt has hx of chronic UTIs, denies hematuria. Tried to go to PCP but they are closed for vacation right now. Nad, skin warm and dry, resp e/u.

## 2013-12-17 NOTE — Telephone Encounter (Signed)
Patient Information:  Caller Name: [name]  Phone: [phone]  Patient: [name], [name]  Gender: Female  DOB: 04/27/1928  Age: 80 Years  PCP: Berniece Andreas (Family Practice)  Office Follow Up:  Does the office need to follow up with this patient?: No  Instructions For The Office: N/A  RN Note:  Asking for appt. to be made in the morning at the office if they are not able to be seen at the ED at [facility]. Pt. has a headache and has not eaten anything in 5 hours. Pt. is a diabetic. Appt. made for 09:45 with Dr. Caryl Never on 12/18/13 if they are not able to be seen today (12/17/13). Instructed to cancel if they can be seen in the ED.  Symptoms  Reason For Call & Symptoms: Lucila Maine is calling and says they are still in the ED and have been there for 5 hours. Pt. is a Diabetic and can only have water. There is no eating in the ED. He says they tell him there are still 7 people ahead of him. Pt. is doing okay now and not vomiting. On an antibiotic for UTI and has been vomiting at night only. Thinks this is the reason she is vomiting. Calling to see if we can help in some way.  Reviewed Health History In EMR: Yes  Reviewed Medications In EMR: Yes  Reviewed Allergies In EMR: Yes  Reviewed Surgeries / Procedures: Yes  Date of Onset of Symptoms: 12/13/2013  Guideline(s) Used:  No Protocol Available - Sick Adult  Disposition Per Guideline:   Home Care  Reason For Disposition Reached:   Patient's symptoms are safe to treat at home per nursing judgment  Advice Given:  Call Back If:  New symptoms develop  You become worse.  Patient Will Follow Care Advice:  YES

## 2013-12-17 NOTE — Telephone Encounter (Signed)
Patient Information:  Caller Name: [name]  Phone: [phone]  Patient: Crytal, Pensinger  Gender: Female  DOB: Dec 03, 1927  Age: 80 Years  PCP: Berniece Andreas (Family Practice)  Office Follow Up:  Does the office need to follow up with this patient?: No  Instructions For The Office: N/A  RN Note:  Advised to take patient to the [facility] ER at this time for evaluation. Caller verbalized understanding.  Symptoms  Reason For Call & Symptoms: Lower abdominal pain. Vomiting x 5 days. Approximately 2-3 episodes of vomiting within the last 24 hours.  Reviewed Health History In EMR: Yes  Reviewed Medications In EMR: Yes  Reviewed Allergies In EMR: Yes  Reviewed Surgeries / Procedures: Yes  Date of Onset of Symptoms: 12/12/2013  Guideline(s) Used:  Vomiting  Disposition Per Guideline:   Go to ED Now  Reason For Disposition Reached:   Moderate vomiting (e.g., 3 - 5 times/day) and age > 58  Advice Given:  N/A  Patient Will Follow Care Advice:  YES

## 2013-12-17 NOTE — ED Notes (Signed)
Patient called for reassessment in Waiting Room with no answer

## 2013-12-17 NOTE — ED Notes (Signed)
Asked pt to provide urine specimen pt stated she could not provide one at this time.  

## 2013-12-18 ENCOUNTER — Encounter: Payer: Self-pay | Admitting: Family Medicine

## 2013-12-18 ENCOUNTER — Ambulatory Visit (INDEPENDENT_AMBULATORY_CARE_PROVIDER_SITE_OTHER): Payer: Medicare Other | Admitting: Family Medicine

## 2013-12-18 VITALS — BP 136/80 | HR 84 | Temp 97.4°F | Wt 142.0 lb

## 2013-12-18 DIAGNOSIS — R109 Unspecified abdominal pain: Secondary | ICD-10-CM

## 2013-12-18 DIAGNOSIS — I6529 Occlusion and stenosis of unspecified carotid artery: Secondary | ICD-10-CM

## 2013-12-18 DIAGNOSIS — R319 Hematuria, unspecified: Secondary | ICD-10-CM

## 2013-12-18 DIAGNOSIS — R1030 Lower abdominal pain, unspecified: Secondary | ICD-10-CM

## 2013-12-18 DIAGNOSIS — R110 Nausea: Secondary | ICD-10-CM

## 2013-12-18 LAB — POCT URINALYSIS DIPSTICK
Bilirubin, UA: NEGATIVE
Glucose, UA: NEGATIVE
Ketones, UA: NEGATIVE
NITRITE UA: NEGATIVE
Spec Grav, UA: <=1.005
UROBILINOGEN UA: 0.2
pH, UA: 6

## 2013-12-18 MED ORDER — CIPROFLOXACIN HCL 250 MG PO TABS: 250.0000 mg | ORAL_TABLET | Freq: Every day | ORAL | Status: DC
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
## 2013-12-18 NOTE — Progress Notes (Signed)
Pre visit review using our clinic review tool, if applicable. No additional management support is needed unless otherwise documented below in the visit note. 

## 2013-12-18 NOTE — Patient Instructions (Signed)
Stay well hydrated. Follow up for any fever or worsening symptoms Consider holding Aricept for any nausea.

## 2013-12-20 LAB — URINE CULTURE
Colony Count: NO GROWTH
Organism ID, Bacteria: NO GROWTH

## 2014-01-12 ENCOUNTER — Encounter: Payer: Medicare Other | Admitting: Internal Medicine

## 2014-01-12 ENCOUNTER — Encounter: Payer: Self-pay | Admitting: Internal Medicine

## 2014-01-12 NOTE — Progress Notes (Signed)
Document opened and reviewed for OV but appt  canceled same day . See phone note

## 2014-01-29 ENCOUNTER — Ambulatory Visit: Payer: Medicare Other | Admitting: Internal Medicine

## 2014-02-05 ENCOUNTER — Telehealth: Payer: Self-pay | Admitting: Internal Medicine

## 2014-02-05 NOTE — Telephone Encounter (Signed)
I have never been able to get shower chairs approved with an order. So cant do that .   Dont know if boost or ensure will discovered  we can try  .  You may need to talk  with social worker or  Insurance provider  to see if will be covered .and what diagnosis needed  To be approved .  Who is coming with her to appt?

## 2014-02-05 NOTE — Telephone Encounter (Signed)
Pt's daughter [name] calling to state she will not be unable to come with her mother to her ov on Monday (02/08/14).  However, [name] would like to know if Dr. Fabian SharpPanosh would write an rx for a shower chair and Boost or Ensure while the pt is here Monday.  Please follow up with daughter regarding this request.

## 2014-02-08 ENCOUNTER — Ambulatory Visit (INDEPENDENT_AMBULATORY_CARE_PROVIDER_SITE_OTHER): Payer: Medicare Other | Admitting: Internal Medicine

## 2014-02-08 ENCOUNTER — Encounter: Payer: Self-pay | Admitting: Internal Medicine

## 2014-02-08 VITALS — BP 144/80 | Temp 97.3°F | Ht 66.0 in | Wt 149.8 lb

## 2014-02-08 DIAGNOSIS — F0390 Unspecified dementia without behavioral disturbance: Secondary | ICD-10-CM

## 2014-02-08 DIAGNOSIS — N390 Urinary tract infection, site not specified: Secondary | ICD-10-CM

## 2014-02-08 DIAGNOSIS — I6529 Occlusion and stenosis of unspecified carotid artery: Secondary | ICD-10-CM

## 2014-02-08 DIAGNOSIS — E1121 Type 2 diabetes mellitus with diabetic nephropathy: Secondary | ICD-10-CM

## 2014-02-08 DIAGNOSIS — Z23 Encounter for immunization: Secondary | ICD-10-CM

## 2014-02-08 DIAGNOSIS — I10 Essential (primary) hypertension: Secondary | ICD-10-CM

## 2014-02-08 DIAGNOSIS — E441 Mild protein-calorie malnutrition: Secondary | ICD-10-CM

## 2014-02-08 DIAGNOSIS — Z87440 Personal history of urinary (tract) infections: Secondary | ICD-10-CM

## 2014-02-08 DIAGNOSIS — N184 Chronic kidney disease, stage 4 (severe): Secondary | ICD-10-CM

## 2014-02-08 LAB — POCT URINALYSIS DIP (MANUAL ENTRY)
Bilirubin, UA: NEGATIVE
Glucose, UA: NEGATIVE
Ketones, POC UA: NEGATIVE
Nitrite, UA: NEGATIVE
SPEC GRAV UA: 1.015
Urobilinogen, UA: 0.2
pH, UA: 7
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
## 2014-02-08 NOTE — Patient Instructions (Addendum)
Will notify you  of labs when available. Advise  Nutrition consult.   About the diabetes and kidney disease diet.   NO uti today   ROV in 4 months or as needed

## 2014-02-08 NOTE — Telephone Encounter (Signed)
Discussed in OV today

## 2014-02-09 LAB — BASIC METABOLIC PANEL
BUN: 43 mg/dL — ABNORMAL HIGH (ref 6–23)
CO2: 29 meq/L (ref 19–32)
Calcium: 9.6 mg/dL (ref 8.4–10.5)
Chloride: 103 mEq/L (ref 96–112)
Creatinine, Ser: 3.7 mg/dL — ABNORMAL HIGH (ref 0.4–1.2)
GFR: 15.17 mL/min — ABNORMAL LOW (ref >60.00)
Glucose, Bld: 111 mg/dL — ABNORMAL HIGH (ref 70–99)
POTASSIUM: 5.4 meq/L — AB (ref 3.5–5.1)
SODIUM: 138 meq/L (ref 135–145)

## 2014-02-09 LAB — HEPATIC FUNCTION PANEL
ALBUMIN: 4.3 g/dL (ref 3.5–5.2)
ALK PHOS: 73 U/L (ref 39–117)
ALT: 10 U/L (ref 0–35)
AST: 19 U/L (ref 0–37)
BILIRUBIN DIRECT: 0.1 mg/dL (ref 0.0–0.3)
TOTAL PROTEIN: 7.3 g/dL (ref 6.0–8.3)
Total Bilirubin: 0.8 mg/dL (ref 0.2–1.2)

## 2014-02-10 LAB — URINE CULTURE: Colony Count: 65000

## 2014-02-15 ENCOUNTER — Other Ambulatory Visit: Payer: Self-pay | Admitting: Family Medicine

## 2014-02-15 DIAGNOSIS — E1121 Type 2 diabetes mellitus with diabetic nephropathy: Secondary | ICD-10-CM

## 2014-02-15 DIAGNOSIS — E441 Mild protein-calorie malnutrition: Secondary | ICD-10-CM

## 2014-02-15 DIAGNOSIS — E875 Hyperkalemia: Secondary | ICD-10-CM

## 2014-02-24 ENCOUNTER — Telehealth: Payer: Self-pay | Admitting: Internal Medicine

## 2014-02-24 ENCOUNTER — Telehealth: Payer: Self-pay | Admitting: Family Medicine

## 2014-02-24 NOTE — Telephone Encounter (Signed)
Misty don't worry about this. Daughter will try other means

## 2014-02-24 NOTE — Telephone Encounter (Signed)
Ingrid notified that the pt has medicare.

## 2014-02-24 NOTE — Telephone Encounter (Signed)
Pt daughter Jerene Dillingngrid called to verify what type of insurance pt has..  [id number]
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
## 2014-02-26 ENCOUNTER — Other Ambulatory Visit: Payer: Self-pay | Admitting: Internal Medicine

## 2014-02-26 NOTE — Telephone Encounter (Signed)
She can call me when I get back  In town.  Name and time to call  Mid day  Or after clinic hours

## 2014-02-26 NOTE — Telephone Encounter (Signed)
Sent to the pharmacy by e-scribe. 

## 2014-03-04 ENCOUNTER — Encounter (HOSPITAL_COMMUNITY): Payer: Self-pay | Admitting: Internal Medicine

## 2014-03-08 NOTE — Telephone Encounter (Signed)
Spoke to [place].  She can be reached anytime Monday through Friday until 1:30.  She works from 2 until 9.  She did stated that she can be reached at work as well.

## 2014-03-29 ENCOUNTER — Telehealth: Payer: Self-pay | Admitting: Internal Medicine

## 2014-03-29 NOTE — Telephone Encounter (Signed)
Patient's daughter would like to speak to you about a letter Dr. [name] was going to write for her about patient's diagnosis. She said you were aware of the letter she is speaking about.

## 2014-03-29 NOTE — Telephone Encounter (Signed)
Let me know when you would like to speak to the patient's daughter per other phone note.

## 2014-03-31 ENCOUNTER — Other Ambulatory Visit (INDEPENDENT_AMBULATORY_CARE_PROVIDER_SITE_OTHER): Payer: Medicare Other

## 2014-03-31 DIAGNOSIS — E875 Hyperkalemia: Secondary | ICD-10-CM

## 2014-03-31 LAB — BASIC METABOLIC PANEL
BUN: 66 mg/dL — AB (ref 6–23)
CALCIUM: 9.8 mg/dL (ref 8.4–10.5)
CO2: 28 meq/L (ref 19–32)
Chloride: 101 mEq/L (ref 96–112)
Creatinine, Ser: 4.1 mg/dL — ABNORMAL HIGH (ref 0.4–1.2)
GFR: 13.15 mL/min — CL (ref >60.00)
Glucose, Bld: 93 mg/dL (ref 70–99)
POTASSIUM: 5.3 meq/L — AB (ref 3.5–5.1)
Sodium: 137 mEq/L (ref 135–145)

## 2014-04-11 ENCOUNTER — Emergency Department: Payer: Self-pay | Admitting: Emergency Medicine

## 2014-04-11 LAB — COMPREHENSIVE METABOLIC PANEL WITH GFR
Albumin: 3.9 g/dL
Alkaline Phosphatase: 111 U/L
Anion Gap: 10
BUN: 61 mg/dL — ABNORMAL HIGH
Bilirubin,Total: 0.4 mg/dL
Calcium, Total: 9.1 mg/dL
Chloride: 101 mmol/L
Co2: 27 mmol/L
Creatinine: 4.22 mg/dL — ABNORMAL HIGH
EGFR (African American): 13 — ABNORMAL LOW
EGFR (Non-African Amer.): 11 — ABNORMAL LOW
Glucose: 102 mg/dL — ABNORMAL HIGH
Osmolality: 293
Potassium: 4.3 mmol/L
SGOT(AST): 30 U/L
SGPT (ALT): 15 U/L
Sodium: 138 mmol/L
Total Protein: 7.8 g/dL

## 2014-04-11 LAB — CBC
HCT: 38.3 %
HGB: 12.4 g/dL
MCH: 27.8 pg
MCHC: 32.5 g/dL
MCV: 86 fL
Platelet: 133 x10 3/mm 3 — ABNORMAL LOW
RBC: 4.47 X10 6/mm 3
RDW: 15 % — ABNORMAL HIGH
WBC: 5.6 x10 3/mm 3

## 2014-04-11 LAB — URINALYSIS, COMPLETE
Bilirubin,UR: NEGATIVE
Glucose,UR: NEGATIVE mg/dL
Ketone: NEGATIVE
Leukocyte Esterase: NEGATIVE
Nitrite: NEGATIVE
Ph: 7
Protein: 100
RBC,UR: 4 /HPF
Specific Gravity: 1.009
Squamous Epithelial: 1
WBC UR: 3 /HPF

## 2014-04-11 LAB — TROPONIN I
Troponin-I: 0.06 ng/mL — ABNORMAL HIGH
Troponin-I: 0.08 ng/mL — ABNORMAL HIGH

## 2014-04-13 LAB — URINE CULTURE

## 2014-04-19 ENCOUNTER — Ambulatory Visit (INDEPENDENT_AMBULATORY_CARE_PROVIDER_SITE_OTHER): Payer: Medicare Other | Admitting: Internal Medicine

## 2014-04-19 ENCOUNTER — Encounter: Payer: Self-pay | Admitting: Internal Medicine

## 2014-04-19 VITALS — BP 134/94 | Temp 97.7°F | Ht 66.0 in | Wt 156.6 lb

## 2014-04-19 DIAGNOSIS — N184 Chronic kidney disease, stage 4 (severe): Secondary | ICD-10-CM

## 2014-04-19 DIAGNOSIS — R059 Cough, unspecified: Secondary | ICD-10-CM

## 2014-04-19 DIAGNOSIS — Z79899 Other long term (current) drug therapy: Secondary | ICD-10-CM | POA: Insufficient documentation

## 2014-04-19 DIAGNOSIS — F0390 Unspecified dementia without behavioral disturbance: Secondary | ICD-10-CM

## 2014-04-19 DIAGNOSIS — Z23 Encounter for immunization: Secondary | ICD-10-CM

## 2014-04-19 DIAGNOSIS — R05 Cough: Secondary | ICD-10-CM

## 2014-04-19 NOTE — Telephone Encounter (Signed)
Disc at OV
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
## 2014-04-19 NOTE — Patient Instructions (Addendum)
Vomiting   Could  Be from the medicine or renal failure.   Take the aricept  With food   Can consider decreasing the dose.  To 5 mg   namenda may or may be helpful.  Has to be very low dose in renal failure . An may not  Work.  Get appt with dr Kathrene BongoGoldsborough  I will send the home health referral.

## 2014-04-20 ENCOUNTER — Telehealth: Payer: Self-pay | Admitting: Internal Medicine

## 2014-04-20 NOTE — Telephone Encounter (Signed)
Dr. Fabian SharpPanosh saw patient yesterday and agreed to enter an order for Home Health.  Can you please enter the referral??    [facility] [phone] (p) [phone] (f)  The main fax number is down, please use the above fax number.  Arman [facility] is going re-fax the form they need completed just in case it was not received.

## 2014-04-20 NOTE — Telephone Encounter (Signed)
Please do this  Misty  I also gave you the form to send in

## 2014-04-20 NOTE — Telephone Encounter (Signed)
Okay to send in home health referral for pt?

## 2014-04-22 ENCOUNTER — Telehealth: Payer: Self-pay | Admitting: Internal Medicine

## 2014-04-22 NOTE — Telephone Encounter (Signed)
Should be taking with evening meal  Or night anyway .  Let us know how that works

## 2014-04-22 NOTE — Telephone Encounter (Signed)
Checked with [facility].  She faxed paper work and received confirmation that the fax was successful.

## 2014-04-22 NOTE — Telephone Encounter (Signed)
[facility] would like to change the time pt takes donepezil (ARICEPT) 10 MG tablet to the afternoon. Pt is not eating in the am. When she takes at night, it makes her nauseous and pt refuses to take anything else. Would like to switch eventually  To nemenda. But in meantime, take donepezil (ARICEPT) 10 MG tablet in the afternoon. Call or fax order to: [phone]

## 2014-04-23 ENCOUNTER — Emergency Department: Payer: Self-pay | Admitting: Emergency Medicine

## 2014-04-23 LAB — CBC
HCT: 35.6 % (ref 35.0–47.0)
HGB: 11.4 g/dL — ABNORMAL LOW (ref 12.0–16.0)
MCH: 27.3 pg (ref 26.0–34.0)
MCHC: 32 g/dL (ref 32.0–36.0)
MCV: 85 fL (ref 80–100)
Platelet: 126 10*3/uL — ABNORMAL LOW (ref 150–440)
RBC: 4.18 10*6/uL (ref 3.80–5.20)
RDW: 15.1 % — ABNORMAL HIGH (ref 11.5–14.5)
WBC: 4.5 10*3/uL (ref 3.6–11.0)

## 2014-04-23 LAB — COMPREHENSIVE METABOLIC PANEL
ALBUMIN: 3.7 g/dL (ref 3.4–5.0)
AST: 28 U/L (ref 15–37)
Alkaline Phosphatase: 97 U/L (ref 46–116)
Anion Gap: 5 — ABNORMAL LOW (ref 7–16)
BUN: 65 mg/dL — ABNORMAL HIGH (ref 7–18)
Bilirubin,Total: 0.5 mg/dL (ref 0.2–1.0)
CHLORIDE: 102 mmol/L (ref 98–107)
Calcium, Total: 9.1 mg/dL (ref 8.5–10.1)
Co2: 29 mmol/L (ref 21–32)
Creatinine: 4.44 mg/dL — ABNORMAL HIGH (ref 0.60–1.30)
EGFR (African American): 12 — ABNORMAL LOW
EGFR (Non-African Amer.): 10 — ABNORMAL LOW
GLUCOSE: 123 mg/dL — AB (ref 65–99)
OSMOLALITY: 292 (ref 275–301)
POTASSIUM: 4.6 mmol/L (ref 3.5–5.1)
SGPT (ALT): 17 U/L (ref 14–63)
SODIUM: 136 mmol/L (ref 136–145)
Total Protein: 7.3 g/dL (ref 6.4–8.2)

## 2014-04-23 LAB — LIPASE, BLOOD: LIPASE: 489 U/L — AB (ref 73–393)

## 2014-04-23 NOTE — Telephone Encounter (Signed)
Order faxed.  Received transmission report that the fax was successful.

## 2014-04-28 ENCOUNTER — Telehealth: Payer: Self-pay | Admitting: Internal Medicine

## 2014-04-28 NOTE — Telephone Encounter (Signed)
[name] called to ask if pt  history and physical can  faxed to [phone]   [facility] phone number [phone]

## 2014-04-30 NOTE — Telephone Encounter (Signed)
Spoke to Zella BallRobin and advised that I do not have a history and physical that I could fax but could fax an office note.  Zella BallRobin agreed to office note.  Confirmed fax number. Faxed to [facility].

## 2014-05-03 ENCOUNTER — Other Ambulatory Visit: Payer: Self-pay | Admitting: Radiology

## 2014-05-03 DIAGNOSIS — I6523 Occlusion and stenosis of bilateral carotid arteries: Secondary | ICD-10-CM

## 2014-05-04 ENCOUNTER — Telehealth: Payer: Self-pay | Admitting: Family Medicine

## 2014-05-04 DIAGNOSIS — G301 Alzheimer's disease with late onset: Secondary | ICD-10-CM | POA: Diagnosis not present

## 2014-05-04 NOTE — Telephone Encounter (Signed)
Can add namenda   7 mg  Er   1 po qd   try 5 mg of aricept instead of 10 mg and see if helps appetite  Has she had  Visit with nephrology  Dr Alba CoryGoldsboroough ? yet and fu of her renal function ?

## 2014-05-04 NOTE — Telephone Encounter (Signed)
Received a phone call from [facility].  She reports pt is not eating and her memory is getting worse.  She is requesting to try something different for memory and to see if Dr. Fabian SharpPanosh will prescribe medication for appetite.

## 2014-05-05 NOTE — Telephone Encounter (Signed)
Tried calling x 2 and received a busy signal.  Will try again at a later time.

## 2014-05-06 NOTE — Telephone Encounter (Signed)
Or can fax any orders Fax:  [phone]  or  [phone]

## 2014-05-11 ENCOUNTER — Encounter (HOSPITAL_COMMUNITY): Payer: Medicare Other

## 2014-05-11 DIAGNOSIS — R0989 Other specified symptoms and signs involving the circulatory and respiratory systems: Secondary | ICD-10-CM

## 2014-05-11 NOTE — Telephone Encounter (Signed)
LM for [facility] to return my call.

## 2014-05-12 ENCOUNTER — Encounter (HOSPITAL_COMMUNITY): Payer: Self-pay | Admitting: Internal Medicine

## 2014-05-13 MED ORDER — MEMANTINE HCL ER 7 MG PO CP24: 7.0000 mg | ORAL_CAPSULE | Freq: Every day | ORAL | Status: DC

## 2014-05-13 NOTE — Telephone Encounter (Signed)
Namenda sent to a local pharmacy and order sent to home health.  Will stop Aricept due to upset stomach and not eating.

## 2014-05-18 ENCOUNTER — Telehealth: Payer: Self-pay | Admitting: Internal Medicine

## 2014-05-18 NOTE — Telephone Encounter (Signed)
Spoke to [facility].  She informed me that Jerene Dillingngrid (daughter) has decided that she does not want the pt to take Namenda due to side effects.  Is not requesting any other medications at this time.

## 2014-05-18 NOTE — Telephone Encounter (Signed)
Noted and agree. 

## 2014-05-18 NOTE — Telephone Encounter (Signed)
Dr Fabian Sharppanosh ordered pt to take namenda and stop aricept. Pt daughter does not want  her mother to start taking namenda Please advise and call  sandria today

## 2014-05-19 ENCOUNTER — Ambulatory Visit (HOSPITAL_COMMUNITY): Payer: Medicare Other | Attending: Cardiovascular Disease | Admitting: *Deleted

## 2014-05-19 DIAGNOSIS — Z87891 Personal history of nicotine dependence: Secondary | ICD-10-CM | POA: Diagnosis not present

## 2014-05-19 DIAGNOSIS — I6523 Occlusion and stenosis of bilateral carotid arteries: Secondary | ICD-10-CM

## 2014-05-19 DIAGNOSIS — I10 Essential (primary) hypertension: Secondary | ICD-10-CM | POA: Insufficient documentation

## 2014-05-19 DIAGNOSIS — E119 Type 2 diabetes mellitus without complications: Secondary | ICD-10-CM | POA: Diagnosis not present

## 2014-05-19 DIAGNOSIS — I739 Peripheral vascular disease, unspecified: Secondary | ICD-10-CM | POA: Insufficient documentation

## 2014-05-19 DIAGNOSIS — E785 Hyperlipidemia, unspecified: Secondary | ICD-10-CM | POA: Diagnosis not present

## 2014-05-19 NOTE — Progress Notes (Signed)
Carotid Duplex Exam Performed 

## 2014-05-20 ENCOUNTER — Telehealth: Payer: Self-pay | Admitting: Family Medicine

## 2014-05-20 DIAGNOSIS — E1121 Type 2 diabetes mellitus with diabetic nephropathy: Secondary | ICD-10-CM

## 2014-05-20 DIAGNOSIS — N184 Chronic kidney disease, stage 4 (severe): Secondary | ICD-10-CM

## 2014-05-20 NOTE — Telephone Encounter (Signed)
Spoke to Kanevillengrid.  She is requesting a referral to podiatry to have her toenails clipped.  Needs a morning appt due to work schedule.  Also needs to know the date when the pt was diagnosed with dementia.  Please advise.  Thanks!

## 2014-05-21 NOTE — Telephone Encounter (Signed)
Please do podiatry referral for dx diabetes with renal disease  And vascular disease  Foot care .  Also reference tot memory problem were noted in the EHR in  Jan 2012 but probably in 2011 or so. formal diagnosis comes later .

## 2014-05-21 NOTE — Telephone Encounter (Signed)
[name] would like a copy of the letter sent to her home @:  [address] [place] [id number]

## 2014-05-28 NOTE — Telephone Encounter (Signed)
Letter placed on WP's desk for review.

## 2014-05-31 ENCOUNTER — Ambulatory Visit (INDEPENDENT_AMBULATORY_CARE_PROVIDER_SITE_OTHER): Payer: Medicare Other

## 2014-05-31 ENCOUNTER — Ambulatory Visit (INDEPENDENT_AMBULATORY_CARE_PROVIDER_SITE_OTHER): Payer: Medicare Other | Admitting: Podiatry

## 2014-05-31 VITALS — BP 138/82 | HR 84 | Resp 16 | Ht 66.0 in | Wt 156.0 lb

## 2014-05-31 DIAGNOSIS — M79676 Pain in unspecified toe(s): Secondary | ICD-10-CM

## 2014-05-31 DIAGNOSIS — B351 Tinea unguium: Secondary | ICD-10-CM | POA: Diagnosis not present

## 2014-05-31 DIAGNOSIS — M79606 Pain in leg, unspecified: Secondary | ICD-10-CM

## 2014-05-31 DIAGNOSIS — E119 Type 2 diabetes mellitus without complications: Secondary | ICD-10-CM

## 2014-05-31 DIAGNOSIS — E1142 Type 2 diabetes mellitus with diabetic polyneuropathy: Secondary | ICD-10-CM

## 2014-05-31 DIAGNOSIS — I6523 Occlusion and stenosis of bilateral carotid arteries: Secondary | ICD-10-CM | POA: Diagnosis not present

## 2014-05-31 NOTE — Patient Instructions (Signed)
Diabetes and Foot Care Diabetes may cause you to have problems because of poor blood supply (circulation) to your feet and legs. This may cause the skin on your feet to become thinner, break easier, and heal more slowly. Your skin may become dry, and the skin may peel and crack. You may also have nerve damage in your legs and feet causing decreased feeling in them. You may not notice minor injuries to your feet that could lead to infections or more serious problems. Taking care of your feet is one of the most important things you can do for yourself.  HOME CARE INSTRUCTIONS  Wear shoes at all times, even in the house. Do not go barefoot. Bare feet are easily injured.  Check your feet daily for blisters, cuts, and redness. If you cannot see the bottom of your feet, use a mirror or ask someone for help.  Wash your feet with warm water (do not use hot water) and mild soap. Then pat your feet and the areas between your toes until they are completely dry. Do not soak your feet as this can dry your skin.  Apply a moisturizing lotion or petroleum jelly (that does not contain alcohol and is unscented) to the skin on your feet and to dry, brittle toenails. Do not apply lotion between your toes.  Trim your toenails straight across. Do not dig under them or around the cuticle. File the edges of your nails with an emery board or nail file.  Do not cut corns or calluses or try to remove them with medicine.  Wear clean socks or stockings every day. Make sure they are not too tight. Do not wear knee-high stockings since they may decrease blood flow to your legs.  Wear shoes that fit properly and have enough cushioning. To break in new shoes, wear them for just a few hours a day. This prevents you from injuring your feet. Always look in your shoes before you put them on to be sure there are no objects inside.  Do not cross your legs. This may decrease the blood flow to your feet.  If you find a minor scrape,  cut, or break in the skin on your feet, keep it and the skin around it clean and dry. These areas may be cleansed with mild soap and water. Do not cleanse the area with peroxide, alcohol, or iodine.  When you remove an adhesive bandage, be sure not to damage the skin around it.  If you have a wound, look at it several times a day to make sure it is healing.  Do not use heating pads or hot water bottles. They may burn your skin. If you have lost feeling in your feet or legs, you may not know it is happening until it is too late.  Make sure your health care provider performs a complete foot exam at least annually or more often if you have foot problems. Report any cuts, sores, or bruises to your health care provider immediately. SEEK MEDICAL CARE IF:   You have an injury that is not healing.  You have cuts or breaks in the skin.  You have an ingrown nail.  You notice redness on your legs or feet.  You feel burning or tingling in your legs or feet.  You have pain or cramps in your legs and feet.  Your legs or feet are numb.  Your feet always feel cold. SEEK IMMEDIATE MEDICAL CARE IF:   There is increasing redness,   swelling, or pain in or around a wound.  There is a red line that goes up your leg.  Pus is coming from a wound.  You develop a fever or as directed by your health care provider.  You notice a bad smell coming from an ulcer or wound. Document Released: 03/09/2000 Document Revised: 11/12/2012 Document Reviewed: 08/19/2012 ExitCare Patient Information 2015 [employer]. This information is not intended to replace advice given to you by your health care provider. Make sure you discuss any questions you have with your health care provider.  

## 2014-05-31 NOTE — Progress Notes (Signed)
   Subjective:    Patient ID: [name], female    DOB: 02/08/1928, 81 y.o.   MRN: [id number]  HPI She needs to have her toenails trimmed. And feet checked. She was taking medication for diabetes but they discontinued it , her last a1c was 5.9     Review of Systems  Constitutional: Positive for appetite change.  HENT:       Sinus problems   Eyes: Positive for visual disturbance.  Respiratory: Positive for shortness of breath and wheezing.   Gastrointestinal: Positive for nausea.  Endocrine: Positive for cold intolerance.       Borderline diabetes   Genitourinary: Positive for urgency.  Musculoskeletal: Positive for back pain.  Allergic/Immunologic: Positive for environmental allergies.  Psychiatric/Behavioral: Positive for confusion.  All other systems reviewed and are negative.      Objective:   Physical Exam: I have reviewed her past medical history medications allergies surgeries and social history. Pulses are strongly palpable bilateral. Epicritic sensation is slightly decreased per Semmes-Weinstein monofilament. Deep tendon reflexes and muscle strength is intact bilateral. Orthopedic evaluation demonstrates all joints distal to the ankle range of motion without crepitus she does have mild osteoarthritic changes of the digits with hammertoe deformities. Cutaneous evaluation demonstrates supple well-hydrated cutis no erythema edema cellulitis drainage or odor. Nails are thick yellow dystrophic clinical and mycotic.        Assessment & Plan:  Assessment: Diabetic peripheral neuropathy with pain in limb secondary to onychomycosis.  Plan: Debridement of nails 1 through 5 bilateral.

## 2014-06-09 ENCOUNTER — Ambulatory Visit: Payer: Medicare Other | Admitting: Internal Medicine

## 2014-06-14 ENCOUNTER — Ambulatory Visit: Payer: Medicare Other | Admitting: Internal Medicine

## 2014-06-28 ENCOUNTER — Ambulatory Visit: Payer: Medicare Other | Admitting: Internal Medicine

## 2014-06-29 ENCOUNTER — Encounter: Payer: Self-pay | Admitting: Internal Medicine

## 2014-06-29 ENCOUNTER — Ambulatory Visit (INDEPENDENT_AMBULATORY_CARE_PROVIDER_SITE_OTHER): Payer: Medicare Other | Admitting: Internal Medicine

## 2014-06-29 VITALS — BP 126/82 | Temp 97.7°F | Wt 166.0 lb

## 2014-06-29 DIAGNOSIS — I6523 Occlusion and stenosis of bilateral carotid arteries: Secondary | ICD-10-CM | POA: Diagnosis not present

## 2014-06-29 DIAGNOSIS — Z79899 Other long term (current) drug therapy: Secondary | ICD-10-CM

## 2014-06-29 DIAGNOSIS — F0390 Unspecified dementia without behavioral disturbance: Secondary | ICD-10-CM

## 2014-06-29 DIAGNOSIS — N184 Chronic kidney disease, stage 4 (severe): Secondary | ICD-10-CM

## 2014-06-29 LAB — BASIC METABOLIC PANEL
BUN: 68 mg/dL — AB (ref 6–23)
CO2: 29 mEq/L (ref 19–32)
Calcium: 9.4 mg/dL (ref 8.4–10.5)
Chloride: 105 mEq/L (ref 96–112)
Creatinine, Ser: 4.67 mg/dL (ref 0.40–1.20)
GFR: 11.44 mL/min — CL (ref >60.00)
Glucose, Bld: 118 mg/dL — ABNORMAL HIGH (ref 70–99)
POTASSIUM: 4.4 meq/L (ref 3.5–5.1)
SODIUM: 138 meq/L (ref 135–145)

## 2014-06-29 MED ORDER — MEMANTINE HCL ER 14 MG PO CP24: 14.0000 mg | ORAL_CAPSULE | Freq: Every day | ORAL | Status: DC

## 2014-06-29 MED ORDER — MEMANTINE HCL ER 7 MG PO CP24: 7.0000 mg | ORAL_CAPSULE | Freq: Every day | ORAL | Status: DC
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
## 2014-06-29 NOTE — Patient Instructions (Addendum)
Option to increase to  namenda to 14 mg per day  To see if tolerated .  ROV in 4 months or as needed.   Consider  DNR sheet  if [name] wishes this .  Will notify you  of labs when available.

## 2014-06-30 DIAGNOSIS — F0280 Dementia in other diseases classified elsewhere without behavioral disturbance: Secondary | ICD-10-CM | POA: Diagnosis not present

## 2014-06-30 DIAGNOSIS — I129 Hypertensive chronic kidney disease with stage 1 through stage 4 chronic kidney disease, or unspecified chronic kidney disease: Secondary | ICD-10-CM | POA: Diagnosis not present

## 2014-06-30 DIAGNOSIS — N184 Chronic kidney disease, stage 4 (severe): Secondary | ICD-10-CM | POA: Diagnosis not present

## 2014-06-30 DIAGNOSIS — G301 Alzheimer's disease with late onset: Secondary | ICD-10-CM | POA: Diagnosis not present

## 2014-07-07 ENCOUNTER — Encounter: Payer: Medicare Other | Admitting: Internal Medicine

## 2014-07-20 ENCOUNTER — Encounter: Payer: Self-pay | Admitting: Internal Medicine

## 2014-07-20 ENCOUNTER — Encounter: Payer: Self-pay | Admitting: Cardiovascular Disease

## 2014-07-20 ENCOUNTER — Ambulatory Visit (INDEPENDENT_AMBULATORY_CARE_PROVIDER_SITE_OTHER): Payer: Medicare Other | Admitting: Internal Medicine

## 2014-07-20 VITALS — BP 150/84 | HR 5 | Ht 66.5 in | Wt 172.2 lb

## 2014-07-20 DIAGNOSIS — R55 Syncope and collapse: Secondary | ICD-10-CM | POA: Diagnosis not present

## 2014-07-20 DIAGNOSIS — I495 Sick sinus syndrome: Secondary | ICD-10-CM | POA: Diagnosis not present

## 2014-07-20 DIAGNOSIS — Z950 Presence of cardiac pacemaker: Secondary | ICD-10-CM | POA: Diagnosis not present

## 2014-07-20 DIAGNOSIS — I6523 Occlusion and stenosis of bilateral carotid arteries: Secondary | ICD-10-CM | POA: Diagnosis not present

## 2014-07-20 DIAGNOSIS — I10 Essential (primary) hypertension: Secondary | ICD-10-CM

## 2014-07-20 LAB — MDC_IDC_ENUM_SESS_TYPE_INCLINIC
Battery Impedance: 103 Ohm
Battery Remaining Longevity: 143 mo
Battery Voltage: 2.79 V
Brady Statistic AP VP Percent: 0 %
Brady Statistic AP VS Percent: 16 %
Brady Statistic AS VP Percent: 0 %
Date Time Interrogation Session: 20160426121400
Lead Channel Impedance Value: 383 Ohm
Lead Channel Pacing Threshold Amplitude: 0.75 V
Lead Channel Sensing Intrinsic Amplitude: 11.2 mV
Lead Channel Sensing Intrinsic Amplitude: 2.8 mV
Lead Channel Setting Pacing Amplitude: 2.25 V
MDC IDC MSMT LEADCHNL RA PACING THRESHOLD AMPLITUDE: 1 V
MDC IDC MSMT LEADCHNL RA PACING THRESHOLD PULSEWIDTH: 0.4 ms
MDC IDC MSMT LEADCHNL RV IMPEDANCE VALUE: 363 Ohm
MDC IDC MSMT LEADCHNL RV PACING THRESHOLD PULSEWIDTH: 0.4 ms
MDC IDC SET LEADCHNL RV PACING AMPLITUDE: 2.5 V
MDC IDC SET LEADCHNL RV PACING PULSEWIDTH: 0.4 ms
MDC IDC SET LEADCHNL RV SENSING SENSITIVITY: 4 mV
MDC IDC STAT BRADY AS VS PERCENT: 84 %

## 2014-07-20 NOTE — Patient Instructions (Signed)
Medication Instructions:  Your physician recommends that you continue on your current medications as directed. Please refer to the Current Medication list given to you today.   Labwork: None ordered  Testing/Procedures: None ordered  Follow-Up: Your physician wants you to follow-up in: 12 months with Dr Taylor You will receive a reminder letter in the mail two months in advance. If you don't receive a letter, please call our office to schedule the follow-up appointment.  Remote monitoring is used to monitor your Pacemaker or ICD from home. This monitoring reduces the number of office visits required to check your device to one time per year. It allows us to keep an eye on the functioning of your device to ensure it is working properly. You are scheduled for a device check from home on 10/19/14. You may send your transmission at any time that day. If you have a wireless device, the transmission will be sent automatically. After your physician reviews your transmission, you will receive a postcard with your next transmission date.    Any Other Special Instructions Will Be Listed Below (If Applicable).   

## 2014-07-20 NOTE — Assessment & Plan Note (Signed)
Her systolic blood pressure is elevated but I would like her to continue her current meds. She is encouraged to maintain a low-sodium diet.

## 2014-07-20 NOTE — Assessment & Plan Note (Signed)
Her Medtronic dual-chamber pacemaker is working normally. We'll plan to recheck in several months. 
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
## 2014-07-20 NOTE — Assessment & Plan Note (Signed)
She has had no recurrent symptoms in the past year. She will undergo watchful waiting.

## 2014-08-02 ENCOUNTER — Telehealth: Payer: Self-pay | Admitting: Internal Medicine

## 2014-08-02 NOTE — Telephone Encounter (Signed)
Daughter would like a cb concerning a med pt is taking

## 2014-08-02 NOTE — Telephone Encounter (Signed)
Spoke to Donaldsonngrid.  She informed me that when she went to get the memantine (NAMENDA XR) 14 MG CP24 24 hr capsule filled that the cost was over $300.  She stated that the pharmacy told her that it was an insurance problem.  [facility]  Advised she would need to call the insurance plan to find out the exact problem.  She will call back if needed.

## 2014-08-08 ENCOUNTER — Other Ambulatory Visit: Payer: Self-pay | Admitting: Internal Medicine

## 2014-08-11 LAB — HM DIABETES EYE EXAM

## 2014-08-12 ENCOUNTER — Telehealth: Payer: Self-pay | Admitting: Internal Medicine

## 2014-08-12 ENCOUNTER — Encounter: Payer: Self-pay | Admitting: Family Medicine

## 2014-08-12 MED ORDER — MEMANTINE HCL ER 7 MG PO CP24: 14.0000 mg | ORAL_CAPSULE | Freq: Every day | ORAL | Status: DC

## 2014-08-12 NOTE — Telephone Encounter (Signed)
Ok to do this  Disp 60 with refill x 6

## 2014-08-12 NOTE — Telephone Encounter (Signed)
Patient's insurance does not cover Namenda 14 mg tab.  Need to write for 7mg  2 tabs qd.  Okay to leave a message on Ingrid's cell when complete.  [facility]

## 2014-08-12 NOTE — Telephone Encounter (Signed)
Pt daughter call and said she has a question about the following medicine memantine (NAMENDA XR) 14 MG CP24 24 hr capsule   [phone]

## 2014-08-12 NOTE — Telephone Encounter (Signed)
Left a message informing Je[facility]illingn[facility]

## 2014-08-16 ENCOUNTER — Telehealth: Payer: Self-pay | Admitting: Family Medicine

## 2014-08-16 NOTE — Telephone Encounter (Signed)
This will need to be addressed by Dr. Fabian SharpPanosh who is more familiar with the patient

## 2014-08-16 NOTE — Telephone Encounter (Signed)
Jerene Dillingngrid (daughter) called to discuss Namenda.  She is requesting a return call.

## 2014-08-16 NOTE — Telephone Encounter (Signed)
Spoke to [place] Viewngrid.  She stated that the Namenda is too expensive and can no longer afford it.  She would like to switch the pt back to Aricept.  Aricept was stopped in the past because it was thought to be making the pt sick.  Ingrid no longer believes this.  Thinks the stomach upset was due to pt not eating well.  Pt is eating well and feeling better.  Advised that I will check with another physician but this may have to wait until Dr. Fabian SharpPanosh returns next week.  Please advise.  Thanks!

## 2014-08-21 NOTE — Telephone Encounter (Signed)
Ok to try aricept again but Take at night  And watch for low bp or dizziness or syncope . 5 mg per day disp 30 refill x 2

## 2014-08-24 ENCOUNTER — Telehealth: Payer: Self-pay | Admitting: Internal Medicine

## 2014-08-24 MED ORDER — DONEPEZIL HCL 5 MG PO TABS: 5.0000 mg | ORAL_TABLET | Freq: Every day | ORAL | Status: DC

## 2014-08-24 NOTE — Telephone Encounter (Signed)
Spoke to [place]grid and informed her that [facility]  Advise that she watch for low bp and dizziness.  Call back if any bad side effects.

## 2014-08-24 NOTE — Telephone Encounter (Signed)
See other phone note

## 2014-08-24 NOTE — Telephone Encounter (Signed)
[name] (daughter) would like dr Fabian Sharppanosh to call her back as soon as she can\ daughter states it is about a med pt was on and now is not.   Daughter would like a cb asap

## 2014-09-13 ENCOUNTER — Ambulatory Visit: Payer: Medicare Other

## 2014-09-14 ENCOUNTER — Ambulatory Visit (INDEPENDENT_AMBULATORY_CARE_PROVIDER_SITE_OTHER): Payer: Medicare Other | Admitting: Internal Medicine

## 2014-09-14 ENCOUNTER — Encounter: Payer: Self-pay | Admitting: Internal Medicine

## 2014-09-14 VITALS — BP 166/96 | Temp 98.2°F | Ht 66.5 in | Wt 161.4 lb

## 2014-09-14 DIAGNOSIS — I6523 Occlusion and stenosis of bilateral carotid arteries: Secondary | ICD-10-CM | POA: Diagnosis not present

## 2014-09-14 DIAGNOSIS — N949 Unspecified condition associated with female genital organs and menstrual cycle: Secondary | ICD-10-CM | POA: Diagnosis not present

## 2014-09-14 DIAGNOSIS — F0390 Unspecified dementia without behavioral disturbance: Secondary | ICD-10-CM | POA: Diagnosis not present

## 2014-09-14 DIAGNOSIS — R300 Dysuria: Secondary | ICD-10-CM

## 2014-09-14 DIAGNOSIS — N184 Chronic kidney disease, stage 4 (severe): Secondary | ICD-10-CM | POA: Diagnosis not present

## 2014-09-14 DIAGNOSIS — Z87440 Personal history of urinary (tract) infections: Secondary | ICD-10-CM

## 2014-09-14 LAB — POCT URINALYSIS DIPSTICK
Bilirubin, UA: NEGATIVE
Glucose, UA: NEGATIVE
Ketones, UA: NEGATIVE
Nitrite, UA: NEGATIVE
PH UA: 5.5
SPEC GRAV UA: 1.02
Urobilinogen, UA: 0.2
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
## 2014-09-14 NOTE — Patient Instructions (Signed)
Exam  Is unremarkable   If ongoing can try  otc vagisil or other moisturizer  If getting sympotms of uti we can  Get urine culture.  I see no evidence of year infection at this time.  But could use monistat otc  If needed  In future.

## 2014-09-15 DIAGNOSIS — Z87440 Personal history of urinary (tract) infections: Secondary | ICD-10-CM | POA: Insufficient documentation

## 2014-09-16 LAB — URINE CULTURE: Colony Count: >=100000

## 2014-09-17 ENCOUNTER — Other Ambulatory Visit: Payer: Self-pay | Admitting: Family Medicine

## 2014-09-17 MED ORDER — CIPROFLOXACIN HCL 250 MG PO TABS: ORAL_TABLET | ORAL | Status: DC

## 2014-09-30 ENCOUNTER — Encounter: Payer: Self-pay | Admitting: Podiatry

## 2014-09-30 ENCOUNTER — Ambulatory Visit (INDEPENDENT_AMBULATORY_CARE_PROVIDER_SITE_OTHER): Payer: Medicare Other | Admitting: Podiatry

## 2014-09-30 DIAGNOSIS — M79676 Pain in unspecified toe(s): Secondary | ICD-10-CM | POA: Diagnosis not present

## 2014-09-30 DIAGNOSIS — B351 Tinea unguium: Secondary | ICD-10-CM

## 2014-09-30 DIAGNOSIS — E1142 Type 2 diabetes mellitus with diabetic polyneuropathy: Secondary | ICD-10-CM

## 2014-09-30 NOTE — Progress Notes (Signed)
Patient ID: [name], female   DOB: 11/16/1927, 81 y.o.   MRN: [id number] Complaint:  Visit Type: Patient returns to my office for continued preventative foot care services. Complaint: Patient states" my nails have grown long and thick and become painful to walk and wear shoes" Patient has been diagnosed with DM with neuropathy. She presents for preventative foot care services. No changes to ROS  Podiatric Exam: Vascular: dorsalis pedis and posterior tibial pulses are palpable bilateral. Capillary return is immediate. Temperature gradient is WNL. Skin turgor WNL  Sensorium: Normal Semmes Weinstein monofilament test. Normal tactile sensation bilaterally. Nail Exam: Pt has thick disfigured discolored nails with subungual debris noted bilateral entire nail hallux through fifth toenails Ulcer Exam: There is no evidence of ulcer or pre-ulcerative changes or infection. Orthopedic Exam: Muscle tone and strength are WNL. No limitations in general ROM. No crepitus or effusions noted. Foot type and digits show no abnormalities. Bony prominences are unremarkable. Skin: No Porokeratosis. No infection or ulcers  Diagnosis:  Tinea unguium, Pain in right toe, pain in left toes  Treatment & Plan Procedures and Treatment: Consent by patient was obtained for treatment procedures. The patient understood the discussion of treatment and procedures well. All questions were answered thoroughly reviewed. Debridement of mycotic and hypertrophic toenails, 1 through 5 bilateral and clearing of subungual debris. No ulceration, no infection noted.  Return Visit-Office Procedure: Patient instructed to return to the office for a follow up visit 3 months for continued evaluation and treatment.

## 2014-10-15 ENCOUNTER — Telehealth: Payer: Self-pay | Admitting: Internal Medicine

## 2014-10-15 ENCOUNTER — Ambulatory Visit: Payer: Medicare Other | Admitting: Internal Medicine

## 2014-10-15 NOTE — Telephone Encounter (Signed)
Appointment made

## 2014-10-15 NOTE — Telephone Encounter (Signed)
[facility] - Client TELEPHONE ADVICE RECORD [facility] Patient Name: TAKAKO MINCKLER DOB: 16-Sep-1927 Initial Comment Caller states mother has been having abd pain. Nurse Assessment Nurse: Chrys Racer, RN, Alexia Freestone Date/Time Lamount Cohen Time): 10/15/2014 8:38:42 AM Confirm and document reason for call. If symptomatic, describe symptoms. ---Caller states mother has been having abd pain., "since yesterday". Daughter states mother has Alzheimer's's Disease, and is a poor historian. Dtr is relying on info provided her by the pt's daily caregiver at her living facility. Caller would like to get her mother in over the next three days if poss. ( "preferably today, but Monday is good Only wants "her" Doctor") Afebrile and no other s/s to report. Caller states pt seen for this before and it ended up being an evolving UTI Has the patient traveled out of the country within the last 30 days? ---No Does the patient require triage? ---Yes Related visit to physician within the last 2 weeks? ---No Does the PT have any chronic conditions? (i.e. diabetes, asthma, etc.) ---Yes List chronic conditions. ---renal failure Guidelines Guideline Title Affirmed Question Affirmed Notes Abdominal Pain - Female [1] MODERATE (e.g., interferes with normal activities) AND [2] pain comes and goes (cramps) AND [3] present > 24 hours (Exception: pain with Vomiting or Diarrhea - see that Guideline) Final Disposition User See Physician within 24 Hours Hall, RN, Alexia Freestone Comments Missed nurse's call Unable to schedule pt appt via Epic as pt has a "remote pacer check scheduled" on Monday,( and Epic "blocking" me from making a later appt Monday for her to be seen) , as per daughter's wishes. TC to office, spoke with "Amber". Office also having same issue in Epic as I had described. Office states they will be able to "squeeze" pt in today at 2 :30 pm. ( Appt w Dr [name]  unavailable for today when I checked earlier in Epic) TC back to pt to update. States her son will bring pt in today Referrals REFERRED TO PCP OFFICE

## 2014-10-18 ENCOUNTER — Telehealth: Payer: Self-pay | Admitting: Internal Medicine

## 2014-10-18 ENCOUNTER — Ambulatory Visit (INDEPENDENT_AMBULATORY_CARE_PROVIDER_SITE_OTHER): Payer: Medicare Other | Admitting: *Deleted

## 2014-10-18 DIAGNOSIS — I495 Sick sinus syndrome: Secondary | ICD-10-CM

## 2014-10-18 DIAGNOSIS — Z950 Presence of cardiac pacemaker: Secondary | ICD-10-CM

## 2014-10-18 NOTE — Telephone Encounter (Signed)
Spoke with patient's daughter- she is on her way to her mother's house to perform remote transmission. Instructed to call [employer] tech services if she has trouble with monitor. She verbalizes understanding.

## 2014-10-18 NOTE — Telephone Encounter (Signed)
New Message ° °4. Are you calling to see if we received your device transmission? Y  ° ° ° °

## 2014-10-20 ENCOUNTER — Ambulatory Visit: Payer: Medicare Other | Admitting: Adult Health

## 2014-10-26 LAB — CUP PACEART REMOTE DEVICE CHECK
Battery Impedance: 127 Ohm
Battery Remaining Longevity: 136 mo
Battery Voltage: 2.79 V
Brady Statistic AP VP Percent: 0 %
Brady Statistic AS VS Percent: 88 %
Lead Channel Impedance Value: 384 Ohm
Lead Channel Setting Pacing Amplitude: 2.5 V
Lead Channel Setting Pacing Amplitude: 2.5 V
Lead Channel Setting Sensing Sensitivity: 5.6 mV
MDC IDC MSMT LEADCHNL RA IMPEDANCE VALUE: 389 Ohm
MDC IDC SESS DTM: 20160725123226
MDC IDC SET LEADCHNL RV PACING PULSEWIDTH: 0.4 ms
MDC IDC STAT BRADY AP VS PERCENT: 12 %
MDC IDC STAT BRADY AS VP PERCENT: 0 %

## 2014-10-26 NOTE — Progress Notes (Signed)
Pacemaker remote check. Device function reviewed. Impedance, sensing, auto capture thresholds consistent with previous measurements. Histograms appropriate for patient and level of activity. All other diagnostic data reviewed and is appropriate and stable for patient. Real time/magnet EGM shows appropriate sensing and capture. No mode switch or ventricular high rate episodes. Estimated longevity 11.5 years. Plan to follow in 3 months remotely, to see in office 06/2015

## 2014-10-29 ENCOUNTER — Ambulatory Visit (INDEPENDENT_AMBULATORY_CARE_PROVIDER_SITE_OTHER): Payer: Medicare Other | Admitting: Internal Medicine

## 2014-10-29 ENCOUNTER — Encounter: Payer: Self-pay | Admitting: Internal Medicine

## 2014-10-29 VITALS — BP 117/80 | HR 93 | Temp 98.1°F | Ht 66.5 in | Wt 154.0 lb

## 2014-10-29 DIAGNOSIS — IMO0001 Reserved for inherently not codable concepts without codable children: Secondary | ICD-10-CM

## 2014-10-29 DIAGNOSIS — N390 Urinary tract infection, site not specified: Secondary | ICD-10-CM

## 2014-10-29 DIAGNOSIS — Z79899 Other long term (current) drug therapy: Secondary | ICD-10-CM

## 2014-10-29 DIAGNOSIS — M545 Low back pain, unspecified: Secondary | ICD-10-CM

## 2014-10-29 DIAGNOSIS — N186 End stage renal disease: Secondary | ICD-10-CM

## 2014-10-29 DIAGNOSIS — F0390 Unspecified dementia without behavioral disturbance: Secondary | ICD-10-CM

## 2014-10-29 DIAGNOSIS — I6523 Occlusion and stenosis of bilateral carotid arteries: Secondary | ICD-10-CM

## 2014-10-29 DIAGNOSIS — R54 Age-related physical debility: Secondary | ICD-10-CM

## 2014-10-29 DIAGNOSIS — R109 Unspecified abdominal pain: Secondary | ICD-10-CM

## 2014-10-29 LAB — POCT URINALYSIS DIP (MANUAL ENTRY)
Bilirubin, UA: NEGATIVE
GLUCOSE UA: NEGATIVE
Ketones, POC UA: NEGATIVE
Nitrite, UA: NEGATIVE
Spec Grav, UA: 1.02
Urobilinogen, UA: 0.2
pH, UA: 6

## 2014-10-29 MED ORDER — DONEPEZIL HCL 10 MG PO TABS: 10.0000 mg | ORAL_TABLET | Freq: Every day | ORAL | Status: DC

## 2014-10-29 NOTE — Patient Instructions (Addendum)
Can try increase aricept to 10 mg if tolerated . Contact us for refill  Continue as you are doing .  Do not take potassium  In any supplements because of renal failure.  Back pain  Could be from arthritis or uti   She may still have bacteria in her urine.  Plan  Culture test and no med for now but if getting worse then contact us .  Will treat based on culture results.
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
## 2014-10-30 LAB — URINE CULTURE

## 2014-11-08 ENCOUNTER — Other Ambulatory Visit: Payer: Self-pay | Admitting: Internal Medicine

## 2014-11-08 NOTE — Telephone Encounter (Signed)
Sent to the pharmacy by e-scribe. 

## 2014-11-24 ENCOUNTER — Telehealth: Payer: Self-pay | Admitting: Internal Medicine

## 2014-11-24 NOTE — Telephone Encounter (Signed)
[name] following up and would like to discuss pt w/ you.  Needs you to inform dr [name] of something.

## 2014-11-24 NOTE — Telephone Encounter (Signed)
Spoke to [place].  Pt called her this morning complaining of abdominal discomfort and discomfort while using the restroom.  Ingrid called back this evening to follow up.  Do to memory issues, pt does not remember the conversation this morning.  Advised that we can collect a urine this weekend (pt lives in [place]) and bring it in on Monday.  Direction instructions were to collect the urine on Sunday, keep in refrigerated and bring it in on Monday.  [name] will pick up urine cups, wipes and hat at the front desk.

## 2014-11-24 NOTE — Telephone Encounter (Signed)
Pt daughter call to ask if Lanice Schwab would give her a call .    [name] [phone]

## 2014-11-25 ENCOUNTER — Encounter: Payer: Self-pay | Admitting: Cardiology

## 2014-11-25 ENCOUNTER — Other Ambulatory Visit (INDEPENDENT_AMBULATORY_CARE_PROVIDER_SITE_OTHER): Payer: Medicare Other

## 2014-11-25 DIAGNOSIS — N399 Disorder of urinary system, unspecified: Secondary | ICD-10-CM

## 2014-11-25 LAB — POCT URINALYSIS DIPSTICK
BILIRUBIN UA: NEGATIVE
Glucose, UA: NEGATIVE
KETONES UA: NEGATIVE
Nitrite, UA: NEGATIVE
PH UA: 7
SPEC GRAV UA: 1.02
Urobilinogen, UA: 0.2

## 2014-11-26 ENCOUNTER — Other Ambulatory Visit: Payer: Self-pay | Admitting: Family Medicine

## 2014-11-26 MED ORDER — CIPROFLOXACIN HCL 250 MG PO TABS: ORAL_TABLET | ORAL | Status: DC

## 2014-12-08 ENCOUNTER — Encounter: Payer: Self-pay | Admitting: Internal Medicine

## 2015-01-06 ENCOUNTER — Ambulatory Visit: Payer: Medicare Other | Admitting: Podiatry

## 2015-01-25 ENCOUNTER — Telehealth: Payer: Self-pay | Admitting: Cardiology

## 2015-01-25 ENCOUNTER — Encounter: Payer: Medicare Other | Admitting: *Deleted

## 2015-01-25 NOTE — Telephone Encounter (Signed)
LMOVM reminding pt to send remote transmission.   

## 2015-01-27 ENCOUNTER — Encounter: Payer: Self-pay | Admitting: Cardiology

## 2015-01-31 ENCOUNTER — Ambulatory Visit (INDEPENDENT_AMBULATORY_CARE_PROVIDER_SITE_OTHER): Payer: Medicare Other | Admitting: *Deleted

## 2015-01-31 DIAGNOSIS — I495 Sick sinus syndrome: Secondary | ICD-10-CM

## 2015-02-01 LAB — CUP PACEART REMOTE DEVICE CHECK
Battery Remaining Longevity: 130 mo
Battery Voltage: 2.79 V
Brady Statistic AP VP Percent: 0 %
Brady Statistic AS VP Percent: 0 %
Brady Statistic AS VS Percent: 86 %
Implantable Lead Implant Date: 20050414
Implantable Lead Implant Date: 20050414
Implantable Lead Location: 753859
Implantable Lead Model: 4470
Implantable Lead Serial Number: 469207
Lead Channel Impedance Value: 384 Ohm
Lead Channel Impedance Value: 396 Ohm
Lead Channel Pacing Threshold Amplitude: 1.25 V
Lead Channel Pacing Threshold Pulse Width: 0.4 ms
Lead Channel Setting Pacing Amplitude: 2.5 V
Lead Channel Setting Pacing Amplitude: 2.5 V
Lead Channel Setting Sensing Sensitivity: 5.6 mV
MDC IDC LEAD LOCATION: 753860
MDC IDC LEAD SERIAL: 427610
MDC IDC MSMT BATTERY IMPEDANCE: 152 Ohm
MDC IDC MSMT LEADCHNL RA SENSING INTR AMPL: 2.8 mV
MDC IDC MSMT LEADCHNL RV PACING THRESHOLD AMPLITUDE: 0.875 V
MDC IDC MSMT LEADCHNL RV PACING THRESHOLD PULSEWIDTH: 0.4 ms
MDC IDC MSMT LEADCHNL RV SENSING INTR AMPL: 16 mV
MDC IDC SESS DTM: 20161105160138
MDC IDC SET LEADCHNL RV PACING PULSEWIDTH: 0.4 ms
MDC IDC STAT BRADY AP VS PERCENT: 14 %

## 2015-02-01 NOTE — Progress Notes (Signed)
Remote pacemaker transmission.   

## 2015-02-08 ENCOUNTER — Telehealth: Payer: Self-pay | Admitting: Internal Medicine

## 2015-02-08 NOTE — Telephone Encounter (Signed)
°  1. Has your device fired? no ° °2. Is you device beeping? no ° °3. Are you experiencing draining or swelling at device site? no ° °4. Are you calling to see if we received your device transmission? yes ° °5. Have you passed out? no ° °

## 2015-02-08 NOTE — Telephone Encounter (Signed)
Spoke w/ pt daughter that we did receive pt transmission.

## 2015-02-09 ENCOUNTER — Encounter: Payer: Self-pay | Admitting: Cardiology

## 2015-03-01 ENCOUNTER — Ambulatory Visit: Payer: Medicare Other | Admitting: Internal Medicine

## 2015-03-04 ENCOUNTER — Ambulatory Visit: Payer: Medicare Other | Admitting: Podiatry

## 2015-04-22 ENCOUNTER — Ambulatory Visit (INDEPENDENT_AMBULATORY_CARE_PROVIDER_SITE_OTHER): Payer: Medicare Other | Admitting: Podiatry

## 2015-04-22 ENCOUNTER — Encounter: Payer: Self-pay | Admitting: Podiatry

## 2015-04-22 DIAGNOSIS — M79676 Pain in unspecified toe(s): Secondary | ICD-10-CM

## 2015-04-22 DIAGNOSIS — E1142 Type 2 diabetes mellitus with diabetic polyneuropathy: Secondary | ICD-10-CM | POA: Diagnosis not present

## 2015-04-22 DIAGNOSIS — B351 Tinea unguium: Secondary | ICD-10-CM | POA: Diagnosis not present

## 2015-04-22 NOTE — Progress Notes (Signed)
Patient ID: [name], female   DOB: 1928/02/02, 82 y.o.   MRN: [id number] Complaint:  Visit Type: Patient returns to my office for continued preventative foot care services. Complaint: Patient states" my nails have grown long and thick and become painful to walk and wear shoes" Patient has been diagnosed with DM with neuropathy. She presents for preventative foot care services. No changes to ROS  Podiatric Exam: Vascular: dorsalis pedis and posterior tibial pulses are palpable bilateral. Capillary return is immediate. Temperature gradient is WNL. Skin turgor WNL  Sensorium: Normal Semmes Weinstein monofilament test. Normal tactile sensation bilaterally. Nail Exam: Pt has thick disfigured discolored nails with subungual debris noted bilateral entire nail hallux through fifth toenails Ulcer Exam: There is no evidence of ulcer or pre-ulcerative changes or infection. Orthopedic Exam: Muscle tone and strength are WNL. No limitations in general ROM. No crepitus or effusions noted. Foot type and digits show no abnormalities. Bony prominences are unremarkable. Skin: No Porokeratosis. No infection or ulcers  Diagnosis:  Tinea unguium, Pain in right toe, pain in left toes  Treatment & Plan Procedures and Treatment: Consent by patient was obtained for treatment procedures. The patient understood the discussion of treatment and procedures well. All questions were answered thoroughly reviewed. Debridement of mycotic and hypertrophic toenails, 1 through 5 bilateral and clearing of subungual debris. No ulceration, no infection noted.  Return Visit-Office Procedure: Patient instructed to return to the office for a follow up visit 3 months for continued evaluation and treatment.   Helane Gunther DPM

## 2015-05-02 ENCOUNTER — Ambulatory Visit (INDEPENDENT_AMBULATORY_CARE_PROVIDER_SITE_OTHER): Payer: Medicare Other | Admitting: *Deleted

## 2015-05-02 DIAGNOSIS — I495 Sick sinus syndrome: Secondary | ICD-10-CM

## 2015-05-03 NOTE — Progress Notes (Signed)
Remote pacemaker transmission.   

## 2015-05-05 ENCOUNTER — Other Ambulatory Visit: Payer: Self-pay | Admitting: Internal Medicine

## 2015-05-05 NOTE — Telephone Encounter (Signed)
Sent to the pharmacy by e-scribe.  Pt has upcoming appt on 05/10/15

## 2015-05-10 ENCOUNTER — Encounter: Payer: Self-pay | Admitting: Internal Medicine

## 2015-05-10 ENCOUNTER — Telehealth: Payer: Self-pay | Admitting: Family Medicine

## 2015-05-10 ENCOUNTER — Ambulatory Visit (INDEPENDENT_AMBULATORY_CARE_PROVIDER_SITE_OTHER): Payer: Medicare Other | Admitting: Internal Medicine

## 2015-05-10 VITALS — BP 136/100 | Temp 97.7°F | Ht 66.5 in | Wt 137.4 lb

## 2015-05-10 DIAGNOSIS — Z23 Encounter for immunization: Secondary | ICD-10-CM

## 2015-05-10 DIAGNOSIS — I10 Essential (primary) hypertension: Secondary | ICD-10-CM

## 2015-05-10 DIAGNOSIS — Z79899 Other long term (current) drug therapy: Secondary | ICD-10-CM

## 2015-05-10 DIAGNOSIS — D631 Anemia in chronic kidney disease: Secondary | ICD-10-CM

## 2015-05-10 DIAGNOSIS — IMO0001 Reserved for inherently not codable concepts without codable children: Secondary | ICD-10-CM

## 2015-05-10 DIAGNOSIS — Z87440 Personal history of urinary (tract) infections: Secondary | ICD-10-CM | POA: Diagnosis not present

## 2015-05-10 DIAGNOSIS — F0390 Unspecified dementia without behavioral disturbance: Secondary | ICD-10-CM

## 2015-05-10 DIAGNOSIS — E1121 Type 2 diabetes mellitus with diabetic nephropathy: Secondary | ICD-10-CM | POA: Diagnosis not present

## 2015-05-10 DIAGNOSIS — N186 End stage renal disease: Secondary | ICD-10-CM

## 2015-05-10 DIAGNOSIS — R54 Age-related physical debility: Secondary | ICD-10-CM | POA: Diagnosis not present

## 2015-05-10 DIAGNOSIS — N189 Chronic kidney disease, unspecified: Secondary | ICD-10-CM

## 2015-05-10 LAB — BASIC METABOLIC PANEL
BUN: 70 mg/dL — AB (ref 6–23)
CALCIUM: 9.6 mg/dL (ref 8.4–10.5)
CO2: 27 mEq/L (ref 19–32)
Chloride: 100 mEq/L (ref 96–112)
Creatinine, Ser: 8.86 mg/dL (ref 0.40–1.20)
GFR: 5.45 mL/min — AB (ref >60.00)
Glucose, Bld: 133 mg/dL — ABNORMAL HIGH (ref 70–99)
POTASSIUM: 5.2 meq/L — AB (ref 3.5–5.1)
Sodium: 139 mEq/L (ref 135–145)

## 2015-05-10 LAB — CBC WITH DIFFERENTIAL/PLATELET
BASOS ABS: 0 10*3/uL (ref 0.0–0.1)
Basophils Relative: 0.6 % (ref 0.0–3.0)
EOS ABS: 0.1 10*3/uL (ref 0.0–0.7)
Eosinophils Relative: 1.5 % (ref 0.0–5.0)
HEMATOCRIT: 35.1 % — AB (ref 36.0–46.0)
Hemoglobin: 11.6 g/dL — ABNORMAL LOW (ref 12.0–15.0)
LYMPHS ABS: 0.8 10*3/uL (ref 0.7–4.0)
LYMPHS PCT: 20.4 % (ref 12.0–46.0)
MCHC: 33 g/dL (ref 30.0–36.0)
MCV: 88.2 fl (ref 78.0–100.0)
MONO ABS: 0.4 10*3/uL (ref 0.1–1.0)
Monocytes Relative: 9 % (ref 3.0–12.0)
NEUTROS ABS: 2.8 10*3/uL (ref 1.4–7.7)
NEUTROS PCT: 68.5 % (ref 43.0–77.0)
PLATELETS: 132 10*3/uL — AB (ref 150.0–400.0)
RBC: 3.98 Mil/uL (ref 3.87–5.11)
RDW: 13.6 % (ref 11.5–15.5)
WBC: 4.1 10*3/uL (ref 4.0–10.5)

## 2015-05-10 LAB — TSH: TSH: 2.54 u[IU]/mL (ref 0.35–4.50)

## 2015-05-10 LAB — HEMOGLOBIN A1C: HEMOGLOBIN A1C: 5.6 % (ref 4.6–6.5)
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
## 2015-05-10 NOTE — Patient Instructions (Addendum)
BP   Up some today.   Take amlodipine every day . To help this . Stop the simvastatin for now.   Stay on vitamins ok  Will notify you  of labs when available.

## 2015-05-10 NOTE — Telephone Encounter (Signed)
Hope from the lab called to report patient's creatinine is 8.86 and GFR is 5.42

## 2015-05-11 NOTE — Telephone Encounter (Signed)
See result note   Kidney ffucntion is much worse .[name] May be the reason she is not eating as well   Stop the aricept as may be adding to nausea. Help get her appt with her nephrologist ( dr Kathrene Bongo ) for advise    Expectant management.   I can talk with ingrid if needed .  Either way need to repeat her BMP in   About a week.

## 2015-05-11 NOTE — Telephone Encounter (Signed)
[name] (daughter) notified.  See result note.

## 2015-05-28 LAB — CUP PACEART REMOTE DEVICE CHECK
Battery Impedance: 152 Ohm
Battery Voltage: 2.79 V
Implantable Lead Implant Date: 20050414
Implantable Lead Implant Date: 20050414
Implantable Lead Location: 753859
Implantable Lead Location: 753860
Implantable Lead Model: 4469
Implantable Lead Model: 4470
Lead Channel Impedance Value: 394 Ohm
Lead Channel Impedance Value: 395 Ohm
MDC IDC LEAD SERIAL: 427610
MDC IDC LEAD SERIAL: 469207
MDC IDC MSMT BATTERY REMAINING LONGEVITY: 128 mo
MDC IDC SESS DTM: 20170205200109
MDC IDC SET LEADCHNL RA PACING AMPLITUDE: 2.75 V
MDC IDC SET LEADCHNL RV PACING AMPLITUDE: 2.5 V
MDC IDC SET LEADCHNL RV PACING PULSEWIDTH: 0.4 ms
MDC IDC SET LEADCHNL RV SENSING SENSITIVITY: 5.6 mV
MDC IDC STAT BRADY AP VP PERCENT: 0 %
MDC IDC STAT BRADY AP VS PERCENT: 14 %
MDC IDC STAT BRADY AS VP PERCENT: 0 %
MDC IDC STAT BRADY AS VS PERCENT: 86 %

## 2015-05-28 NOTE — Progress Notes (Signed)
Normal remote reviewed. 2 NSVT episodes   Next follow up GT 06/2015

## 2015-05-29 ENCOUNTER — Encounter: Payer: Self-pay | Admitting: Cardiology

## 2015-07-13 NOTE — Telephone Encounter (Signed)
Close  

## 2015-07-15 ENCOUNTER — Ambulatory Visit (INDEPENDENT_AMBULATORY_CARE_PROVIDER_SITE_OTHER): Payer: Medicare Other | Admitting: Internal Medicine

## 2015-07-15 ENCOUNTER — Encounter: Payer: Self-pay | Admitting: Internal Medicine

## 2015-07-15 VITALS — BP 162/88 | HR 79 | Ht 67.0 in | Wt 157.0 lb

## 2015-07-15 DIAGNOSIS — I495 Sick sinus syndrome: Secondary | ICD-10-CM | POA: Diagnosis not present

## 2015-07-15 MED ORDER — AMLODIPINE BESYLATE 10 MG PO TABS: 10.0000 mg | ORAL_TABLET | Freq: Every day | ORAL | Status: AC
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
## 2015-07-15 NOTE — Patient Instructions (Addendum)
Medication Instructions:  Your physician has recommended you make the following change in your medication:  1) Increase Amlodipine to 10 mg daily   Labwork: None ordered   Testing/Procedures: None ordered   Follow-Up: Your physician wants you to follow-up in: 12 months with Dr Court Joyaylor You will receive a reminder letter in the mail two months in advance. If you don't receive a letter, please call our office to schedule the follow-up appointment.   Remote monitoring is used to monitor your Pacemaker from home. This monitoring reduces the number of office visits required to check your device to one time per year. It allows us to keep an eye on the functioning of your device to ensure it is working properly. You are scheduled for a device check from home on 10/17/15. You may send your transmission at any time that day. If you have a wireless device, the transmission will be sent automatically. After your physician reviews your transmission, you will receive a postcard with your next transmission date.    Any Other Special Instructions Will Be Listed Below (If Applicable).     If you need a refill on your cardiac medications before your next appointment, please call your pharmacy.

## 2015-07-22 LAB — CUP PACEART INCLINIC DEVICE CHECK
Battery Remaining Longevity: 124 mo
Battery Voltage: 2.79 V
Brady Statistic AP VP Percent: 0 %
Brady Statistic AS VP Percent: 0 %
Date Time Interrogation Session: 20170421195019
Implantable Lead Implant Date: 20050414
Implantable Lead Location: 753859
Implantable Lead Model: 4469
Implantable Lead Model: 4470
Implantable Lead Serial Number: 469207
Lead Channel Impedance Value: 356 Ohm
Lead Channel Pacing Threshold Amplitude: 1.625 V
Lead Channel Pacing Threshold Pulse Width: 0.4 ms
Lead Channel Setting Pacing Amplitude: 2.5 V
MDC IDC LEAD IMPLANT DT: 20050414
MDC IDC LEAD LOCATION: 753860
MDC IDC LEAD SERIAL: 427610
MDC IDC MSMT BATTERY IMPEDANCE: 176 Ohm
MDC IDC MSMT LEADCHNL RA SENSING INTR AMPL: 2 mV
MDC IDC MSMT LEADCHNL RV IMPEDANCE VALUE: 363 Ohm
MDC IDC MSMT LEADCHNL RV PACING THRESHOLD AMPLITUDE: 0.75 V
MDC IDC MSMT LEADCHNL RV PACING THRESHOLD PULSEWIDTH: 0.4 ms
MDC IDC MSMT LEADCHNL RV SENSING INTR AMPL: 11.2 mV
MDC IDC SET LEADCHNL RA PACING AMPLITUDE: 3.25 V
MDC IDC SET LEADCHNL RV PACING PULSEWIDTH: 0.4 ms
MDC IDC SET LEADCHNL RV SENSING SENSITIVITY: 4 mV
MDC IDC STAT BRADY AP VS PERCENT: 13 %
MDC IDC STAT BRADY AS VS PERCENT: 86 %

## 2015-08-19 ENCOUNTER — Ambulatory Visit: Payer: Medicare Other | Admitting: Podiatry

## 2015-09-24 DEATH — deceased

## 2015-10-26 ENCOUNTER — Ambulatory Visit: Payer: Medicare Other | Admitting: Internal Medicine
